# Patient Record
Sex: Female | Born: 1945 | Race: White | Hispanic: No | Marital: Married | Smoking: Never smoker
Health system: Southern US, Community
[De-identification: ages and names within clinical notes are randomized; demographics above are authoritative.]

## PROBLEM LIST (undated history)

## (undated) DIAGNOSIS — I10 Essential (primary) hypertension: Secondary | ICD-10-CM

## (undated) DIAGNOSIS — N189 Chronic kidney disease, unspecified: Secondary | ICD-10-CM

## (undated) DIAGNOSIS — M1990 Unspecified osteoarthritis, unspecified site: Secondary | ICD-10-CM

## (undated) DIAGNOSIS — D649 Anemia, unspecified: Secondary | ICD-10-CM

## (undated) DIAGNOSIS — K219 Gastro-esophageal reflux disease without esophagitis: Secondary | ICD-10-CM

## (undated) HISTORY — PX: CATARACT EXTRACTION: SUR2

## (undated) HISTORY — PX: EYE SURGERY: SHX253

## (undated) HISTORY — PX: ABDOMINAL HYSTERECTOMY: SHX81

---

## 2000-01-25 ENCOUNTER — Other Ambulatory Visit: Admission: RE | Admit: 2000-01-25 | Discharge: 2000-01-25 | Payer: Self-pay | Admitting: Gynecology

## 2002-03-05 ENCOUNTER — Encounter: Admission: RE | Admit: 2002-03-05 | Discharge: 2002-03-05 | Payer: Self-pay

## 2003-06-04 ENCOUNTER — Encounter: Admission: RE | Admit: 2003-06-04 | Discharge: 2003-06-04 | Payer: Self-pay

## 2003-07-15 ENCOUNTER — Other Ambulatory Visit: Admission: RE | Admit: 2003-07-15 | Discharge: 2003-07-15 | Payer: Self-pay | Admitting: Gynecology

## 2004-07-30 ENCOUNTER — Encounter: Admission: RE | Admit: 2004-07-30 | Discharge: 2004-07-30 | Payer: Self-pay | Admitting: Family Medicine

## 2004-08-13 ENCOUNTER — Encounter: Admission: RE | Admit: 2004-08-13 | Discharge: 2004-08-13 | Payer: Self-pay | Admitting: Family Medicine

## 2004-11-27 ENCOUNTER — Ambulatory Visit (HOSPITAL_COMMUNITY): Admission: RE | Admit: 2004-11-27 | Discharge: 2004-11-27 | Payer: Self-pay | Admitting: Gastroenterology

## 2005-09-27 ENCOUNTER — Encounter: Admission: RE | Admit: 2005-09-27 | Discharge: 2005-09-27 | Payer: Self-pay | Admitting: Family Medicine

## 2005-10-12 ENCOUNTER — Encounter: Admission: RE | Admit: 2005-10-12 | Discharge: 2005-10-12 | Payer: Self-pay | Admitting: Family Medicine

## 2006-10-13 ENCOUNTER — Encounter: Admission: RE | Admit: 2006-10-13 | Discharge: 2006-10-13 | Payer: Self-pay | Admitting: Family Medicine

## 2007-10-16 ENCOUNTER — Encounter: Admission: RE | Admit: 2007-10-16 | Discharge: 2007-10-16 | Payer: Self-pay | Admitting: Family Medicine

## 2008-10-22 ENCOUNTER — Encounter: Admission: RE | Admit: 2008-10-22 | Discharge: 2008-10-22 | Payer: Self-pay | Admitting: Family Medicine

## 2009-12-03 ENCOUNTER — Encounter: Admission: RE | Admit: 2009-12-03 | Discharge: 2009-12-03 | Payer: Self-pay | Admitting: Family Medicine

## 2010-11-21 ENCOUNTER — Other Ambulatory Visit: Payer: Self-pay | Admitting: Family Medicine

## 2010-11-21 DIAGNOSIS — Z1239 Encounter for other screening for malignant neoplasm of breast: Secondary | ICD-10-CM

## 2010-11-21 DIAGNOSIS — Z1231 Encounter for screening mammogram for malignant neoplasm of breast: Secondary | ICD-10-CM

## 2010-11-25 ENCOUNTER — Other Ambulatory Visit: Payer: Self-pay | Admitting: Family Medicine

## 2010-11-25 DIAGNOSIS — M8580 Other specified disorders of bone density and structure, unspecified site: Secondary | ICD-10-CM

## 2010-12-09 ENCOUNTER — Ambulatory Visit
Admission: RE | Admit: 2010-12-09 | Discharge: 2010-12-09 | Disposition: A | Discharge status: Home / Self Care | Payer: Medicare Other | Source: Ambulatory Visit | Attending: Family Medicine | Admitting: Family Medicine

## 2010-12-09 DIAGNOSIS — Z1231 Encounter for screening mammogram for malignant neoplasm of breast: Secondary | ICD-10-CM

## 2010-12-29 ENCOUNTER — Other Ambulatory Visit: Payer: Self-pay

## 2010-12-30 ENCOUNTER — Ambulatory Visit
Admission: RE | Admit: 2010-12-30 | Discharge: 2010-12-30 | Disposition: A | Discharge status: Home / Self Care | Payer: BLUE CROSS/BLUE SHIELD | Source: Ambulatory Visit | Attending: Family Medicine | Admitting: Family Medicine

## 2010-12-30 DIAGNOSIS — M8580 Other specified disorders of bone density and structure, unspecified site: Secondary | ICD-10-CM

## 2011-03-19 NOTE — Op Note (Signed)
NAMELUCAS, Rita            ACCOUNT NO.:  [id number]   MEDICAL RECORD NO.:  [id number]          PATIENT TYPE:  AMB   LOCATION:  ENDO                         FACILITY:  [facility]   PHYSICIAN:  James L. Rolla Flatten., M.D.DATE OF BIRTH:  [birth year]   DATE OF PROCEDURE:  11/27/2004  DATE OF DISCHARGE:                                 OPERATIVE REPORT   PROCEDURE:  Colonoscopy.   MEDICATIONS:  1.  Fentanyl 80 mcg IV.  2.  Versed 8 mg IV.   SCOPE:  Olympus pediatric adjustable colonoscope.   INDICATIONS:  The patient has had a strong family history of colon polyps in  her brother.   DESCRIPTION OF PROCEDURE:  The procedure had been explained to the patient  and consent obtained.  With the patient in the left lateral decubitus  position, the Olympus scope was inserted and advanced.  The prep was  excellent.  We were able to reach the cecum using abdominal pressure and  position changes.  The ileocecal valve and appendiceal orifice were seen.   The scope was withdrawn; the cecum, ascending colon, transverse colon,  descending and sigmoid colon were seen well.  Upon removal no polyps or  other lesions were seen.  The patient did have scattered diverticula.   The scope was withdrawn.  The patient tolerated the procedure well.   ASSESSMENT:  Strong family history of colon polyps, with negative  colonoscopy.  (V6 16.0)   PLAN:  Will recommend yearly Hemoccult.  Repeat colonoscopy in five years.      JLE/MEDQ  D:  11/27/2004  T:  11/27/2004  Job:  [id number]   cc:   Ruthy Dick, M.D.  [place], [place]

## 2011-11-05 ENCOUNTER — Other Ambulatory Visit: Payer: Self-pay | Admitting: Family Medicine

## 2011-11-05 DIAGNOSIS — Z1231 Encounter for screening mammogram for malignant neoplasm of breast: Secondary | ICD-10-CM

## 2011-12-08 DIAGNOSIS — IMO0002 Reserved for concepts with insufficient information to code with codable children: Secondary | ICD-10-CM | POA: Diagnosis not present

## 2011-12-08 DIAGNOSIS — Z79899 Other long term (current) drug therapy: Secondary | ICD-10-CM | POA: Diagnosis not present

## 2011-12-08 DIAGNOSIS — E7800 Pure hypercholesterolemia, unspecified: Secondary | ICD-10-CM | POA: Diagnosis not present

## 2011-12-08 DIAGNOSIS — Z0000 Encounter for general adult medical examination without abnormal findings: Secondary | ICD-10-CM | POA: Diagnosis not present

## 2011-12-08 DIAGNOSIS — I10 Essential (primary) hypertension: Secondary | ICD-10-CM | POA: Diagnosis not present

## 2011-12-13 ENCOUNTER — Ambulatory Visit
Admission: RE | Admit: 2011-12-13 | Discharge: 2011-12-13 | Disposition: A | Discharge status: Home / Self Care | Payer: Medicare Other | Source: Ambulatory Visit | Attending: Family Medicine | Admitting: Family Medicine

## 2011-12-13 DIAGNOSIS — Z1231 Encounter for screening mammogram for malignant neoplasm of breast: Secondary | ICD-10-CM | POA: Diagnosis not present

## 2012-06-07 DIAGNOSIS — IMO0002 Reserved for concepts with insufficient information to code with codable children: Secondary | ICD-10-CM | POA: Diagnosis not present

## 2012-06-07 DIAGNOSIS — E7800 Pure hypercholesterolemia, unspecified: Secondary | ICD-10-CM | POA: Diagnosis not present

## 2012-06-07 DIAGNOSIS — I10 Essential (primary) hypertension: Secondary | ICD-10-CM | POA: Diagnosis not present

## 2012-06-28 DIAGNOSIS — H25099 Other age-related incipient cataract, unspecified eye: Secondary | ICD-10-CM | POA: Diagnosis not present

## 2012-07-24 ENCOUNTER — Other Ambulatory Visit: Payer: Self-pay | Admitting: Family Medicine

## 2012-07-24 DIAGNOSIS — R1011 Right upper quadrant pain: Secondary | ICD-10-CM | POA: Diagnosis not present

## 2012-07-24 DIAGNOSIS — Z23 Encounter for immunization: Secondary | ICD-10-CM | POA: Diagnosis not present

## 2012-07-26 ENCOUNTER — Ambulatory Visit
Admission: RE | Admit: 2012-07-26 | Discharge: 2012-07-26 | Disposition: A | Discharge status: Home / Self Care | Payer: Medicare Other | Source: Ambulatory Visit | Attending: Family Medicine | Admitting: Family Medicine

## 2012-07-26 DIAGNOSIS — R1011 Right upper quadrant pain: Secondary | ICD-10-CM

## 2012-07-26 DIAGNOSIS — N289 Disorder of kidney and ureter, unspecified: Secondary | ICD-10-CM | POA: Diagnosis not present

## 2012-07-31 DIAGNOSIS — R1011 Right upper quadrant pain: Secondary | ICD-10-CM | POA: Diagnosis not present

## 2012-12-04 ENCOUNTER — Other Ambulatory Visit: Payer: Self-pay | Admitting: Family Medicine

## 2012-12-04 DIAGNOSIS — Z1231 Encounter for screening mammogram for malignant neoplasm of breast: Secondary | ICD-10-CM

## 2012-12-13 ENCOUNTER — Other Ambulatory Visit: Payer: Self-pay | Admitting: Family Medicine

## 2012-12-13 DIAGNOSIS — IMO0002 Reserved for concepts with insufficient information to code with codable children: Secondary | ICD-10-CM | POA: Diagnosis not present

## 2012-12-13 DIAGNOSIS — M8580 Other specified disorders of bone density and structure, unspecified site: Secondary | ICD-10-CM

## 2012-12-13 DIAGNOSIS — E7800 Pure hypercholesterolemia, unspecified: Secondary | ICD-10-CM | POA: Diagnosis not present

## 2012-12-13 DIAGNOSIS — I10 Essential (primary) hypertension: Secondary | ICD-10-CM | POA: Diagnosis not present

## 2012-12-13 DIAGNOSIS — Z0000 Encounter for general adult medical examination without abnormal findings: Secondary | ICD-10-CM | POA: Diagnosis not present

## 2013-01-04 ENCOUNTER — Ambulatory Visit
Admission: RE | Admit: 2013-01-04 | Discharge: 2013-01-04 | Disposition: A | Discharge status: Home / Self Care | Payer: Medicare Other | Source: Ambulatory Visit | Attending: Family Medicine | Admitting: Family Medicine

## 2013-01-04 DIAGNOSIS — Z1231 Encounter for screening mammogram for malignant neoplasm of breast: Secondary | ICD-10-CM | POA: Diagnosis not present

## 2013-01-04 DIAGNOSIS — M899 Disorder of bone, unspecified: Secondary | ICD-10-CM | POA: Diagnosis not present

## 2013-01-04 DIAGNOSIS — M949 Disorder of cartilage, unspecified: Secondary | ICD-10-CM | POA: Diagnosis not present

## 2013-01-04 DIAGNOSIS — M8580 Other specified disorders of bone density and structure, unspecified site: Secondary | ICD-10-CM

## 2013-01-11 ENCOUNTER — Other Ambulatory Visit: Payer: Self-pay | Admitting: Family Medicine

## 2013-01-11 DIAGNOSIS — R928 Other abnormal and inconclusive findings on diagnostic imaging of breast: Secondary | ICD-10-CM

## 2013-01-23 ENCOUNTER — Ambulatory Visit
Admission: RE | Admit: 2013-01-23 | Discharge: 2013-01-23 | Disposition: A | Discharge status: Home / Self Care | Payer: Medicare Other | Source: Ambulatory Visit | Attending: Family Medicine | Admitting: Family Medicine

## 2013-01-23 DIAGNOSIS — R928 Other abnormal and inconclusive findings on diagnostic imaging of breast: Secondary | ICD-10-CM

## 2013-06-13 DIAGNOSIS — I10 Essential (primary) hypertension: Secondary | ICD-10-CM | POA: Diagnosis not present

## 2013-06-13 DIAGNOSIS — E7800 Pure hypercholesterolemia, unspecified: Secondary | ICD-10-CM | POA: Diagnosis not present

## 2013-06-13 DIAGNOSIS — M899 Disorder of bone, unspecified: Secondary | ICD-10-CM | POA: Diagnosis not present

## 2013-07-16 DIAGNOSIS — R197 Diarrhea, unspecified: Secondary | ICD-10-CM | POA: Diagnosis not present

## 2013-07-17 DIAGNOSIS — R197 Diarrhea, unspecified: Secondary | ICD-10-CM | POA: Diagnosis not present

## 2013-08-09 DIAGNOSIS — IMO0002 Reserved for concepts with insufficient information to code with codable children: Secondary | ICD-10-CM | POA: Diagnosis not present

## 2013-08-23 DIAGNOSIS — Z23 Encounter for immunization: Secondary | ICD-10-CM | POA: Diagnosis not present

## 2013-09-04 DIAGNOSIS — H02839 Dermatochalasis of unspecified eye, unspecified eyelid: Secondary | ICD-10-CM | POA: Diagnosis not present

## 2013-09-04 DIAGNOSIS — H25049 Posterior subcapsular polar age-related cataract, unspecified eye: Secondary | ICD-10-CM | POA: Diagnosis not present

## 2013-09-04 DIAGNOSIS — H5210 Myopia, unspecified eye: Secondary | ICD-10-CM | POA: Diagnosis not present

## 2013-09-04 DIAGNOSIS — H25019 Cortical age-related cataract, unspecified eye: Secondary | ICD-10-CM | POA: Diagnosis not present

## 2013-09-04 DIAGNOSIS — H2510 Age-related nuclear cataract, unspecified eye: Secondary | ICD-10-CM | POA: Diagnosis not present

## 2013-09-04 DIAGNOSIS — H18419 Arcus senilis, unspecified eye: Secondary | ICD-10-CM | POA: Diagnosis not present

## 2013-11-05 DIAGNOSIS — H269 Unspecified cataract: Secondary | ICD-10-CM | POA: Diagnosis not present

## 2013-11-05 DIAGNOSIS — H2510 Age-related nuclear cataract, unspecified eye: Secondary | ICD-10-CM | POA: Diagnosis not present

## 2013-11-06 DIAGNOSIS — H2510 Age-related nuclear cataract, unspecified eye: Secondary | ICD-10-CM | POA: Diagnosis not present

## 2013-11-19 DIAGNOSIS — H269 Unspecified cataract: Secondary | ICD-10-CM | POA: Diagnosis not present

## 2013-11-19 DIAGNOSIS — H2510 Age-related nuclear cataract, unspecified eye: Secondary | ICD-10-CM | POA: Diagnosis not present

## 2013-12-17 ENCOUNTER — Other Ambulatory Visit: Payer: Self-pay

## 2013-12-17 DIAGNOSIS — I10 Essential (primary) hypertension: Secondary | ICD-10-CM | POA: Diagnosis not present

## 2013-12-17 DIAGNOSIS — Z1231 Encounter for screening mammogram for malignant neoplasm of breast: Secondary | ICD-10-CM

## 2013-12-17 DIAGNOSIS — E7800 Pure hypercholesterolemia, unspecified: Secondary | ICD-10-CM | POA: Diagnosis not present

## 2013-12-17 DIAGNOSIS — N183 Chronic kidney disease, stage 3 unspecified: Secondary | ICD-10-CM | POA: Diagnosis not present

## 2013-12-17 DIAGNOSIS — Z0000 Encounter for general adult medical examination without abnormal findings: Secondary | ICD-10-CM | POA: Diagnosis not present

## 2014-02-05 ENCOUNTER — Ambulatory Visit
Admission: RE | Admit: 2014-02-05 | Discharge: 2014-02-05 | Disposition: A | Discharge status: Home / Self Care | Payer: BC Managed Care – PPO | Source: Ambulatory Visit

## 2014-02-05 DIAGNOSIS — Z1231 Encounter for screening mammogram for malignant neoplasm of breast: Secondary | ICD-10-CM | POA: Diagnosis not present

## 2014-06-24 DIAGNOSIS — E7800 Pure hypercholesterolemia, unspecified: Secondary | ICD-10-CM | POA: Diagnosis not present

## 2014-06-24 DIAGNOSIS — I10 Essential (primary) hypertension: Secondary | ICD-10-CM | POA: Diagnosis not present

## 2014-06-24 DIAGNOSIS — N183 Chronic kidney disease, stage 3 unspecified: Secondary | ICD-10-CM | POA: Diagnosis not present

## 2014-08-16 DIAGNOSIS — Z23 Encounter for immunization: Secondary | ICD-10-CM | POA: Diagnosis not present

## 2014-12-13 DIAGNOSIS — Z1211 Encounter for screening for malignant neoplasm of colon: Secondary | ICD-10-CM | POA: Diagnosis not present

## 2014-12-13 DIAGNOSIS — Z8371 Family history of colonic polyps: Secondary | ICD-10-CM | POA: Diagnosis not present

## 2014-12-25 DIAGNOSIS — N183 Chronic kidney disease, stage 3 (moderate): Secondary | ICD-10-CM | POA: Diagnosis not present

## 2014-12-25 DIAGNOSIS — M8580 Other specified disorders of bone density and structure, unspecified site: Secondary | ICD-10-CM | POA: Diagnosis not present

## 2014-12-25 DIAGNOSIS — Z6823 Body mass index (BMI) 23.0-23.9, adult: Secondary | ICD-10-CM | POA: Diagnosis not present

## 2014-12-25 DIAGNOSIS — Z1389 Encounter for screening for other disorder: Secondary | ICD-10-CM | POA: Diagnosis not present

## 2014-12-25 DIAGNOSIS — Z0000 Encounter for general adult medical examination without abnormal findings: Secondary | ICD-10-CM | POA: Diagnosis not present

## 2014-12-25 DIAGNOSIS — E780 Pure hypercholesterolemia: Secondary | ICD-10-CM | POA: Diagnosis not present

## 2014-12-25 DIAGNOSIS — Z9181 History of falling: Secondary | ICD-10-CM | POA: Diagnosis not present

## 2014-12-25 DIAGNOSIS — I10 Essential (primary) hypertension: Secondary | ICD-10-CM | POA: Diagnosis not present

## 2014-12-27 ENCOUNTER — Other Ambulatory Visit: Payer: Self-pay | Admitting: Family Medicine

## 2014-12-27 DIAGNOSIS — Z1231 Encounter for screening mammogram for malignant neoplasm of breast: Secondary | ICD-10-CM

## 2014-12-27 DIAGNOSIS — R5381 Other malaise: Secondary | ICD-10-CM

## 2014-12-30 ENCOUNTER — Other Ambulatory Visit: Payer: Self-pay | Admitting: Family Medicine

## 2014-12-30 DIAGNOSIS — M8580 Other specified disorders of bone density and structure, unspecified site: Secondary | ICD-10-CM

## 2015-02-10 ENCOUNTER — Ambulatory Visit
Admission: RE | Admit: 2015-02-10 | Discharge: 2015-02-10 | Disposition: A | Discharge status: Home / Self Care | Payer: Medicare Other | Source: Ambulatory Visit | Attending: Family Medicine | Admitting: Family Medicine

## 2015-02-10 DIAGNOSIS — M8580 Other specified disorders of bone density and structure, unspecified site: Secondary | ICD-10-CM

## 2015-02-10 DIAGNOSIS — M85852 Other specified disorders of bone density and structure, left thigh: Secondary | ICD-10-CM | POA: Diagnosis not present

## 2015-02-10 DIAGNOSIS — Z1231 Encounter for screening mammogram for malignant neoplasm of breast: Secondary | ICD-10-CM | POA: Diagnosis not present

## 2015-02-10 DIAGNOSIS — M8588 Other specified disorders of bone density and structure, other site: Secondary | ICD-10-CM | POA: Diagnosis not present

## 2015-04-03 DIAGNOSIS — M19071 Primary osteoarthritis, right ankle and foot: Secondary | ICD-10-CM | POA: Diagnosis not present

## 2015-04-03 DIAGNOSIS — M19072 Primary osteoarthritis, left ankle and foot: Secondary | ICD-10-CM | POA: Diagnosis not present

## 2015-04-03 DIAGNOSIS — Q828 Other specified congenital malformations of skin: Secondary | ICD-10-CM | POA: Diagnosis not present

## 2015-04-03 DIAGNOSIS — M2012 Hallux valgus (acquired), left foot: Secondary | ICD-10-CM | POA: Diagnosis not present

## 2015-05-16 DIAGNOSIS — M2022 Hallux rigidus, left foot: Secondary | ICD-10-CM | POA: Diagnosis not present

## 2015-05-16 DIAGNOSIS — M2012 Hallux valgus (acquired), left foot: Secondary | ICD-10-CM | POA: Diagnosis not present

## 2015-06-30 DIAGNOSIS — E780 Pure hypercholesterolemia: Secondary | ICD-10-CM | POA: Diagnosis not present

## 2015-06-30 DIAGNOSIS — I10 Essential (primary) hypertension: Secondary | ICD-10-CM | POA: Diagnosis not present

## 2015-06-30 DIAGNOSIS — Z1389 Encounter for screening for other disorder: Secondary | ICD-10-CM | POA: Diagnosis not present

## 2015-06-30 DIAGNOSIS — Z23 Encounter for immunization: Secondary | ICD-10-CM | POA: Diagnosis not present

## 2015-06-30 DIAGNOSIS — Z9181 History of falling: Secondary | ICD-10-CM | POA: Diagnosis not present

## 2015-06-30 DIAGNOSIS — N3281 Overactive bladder: Secondary | ICD-10-CM | POA: Diagnosis not present

## 2015-06-30 DIAGNOSIS — N183 Chronic kidney disease, stage 3 (moderate): Secondary | ICD-10-CM | POA: Diagnosis not present

## 2015-06-30 DIAGNOSIS — B351 Tinea unguium: Secondary | ICD-10-CM | POA: Diagnosis not present

## 2015-08-13 DIAGNOSIS — R197 Diarrhea, unspecified: Secondary | ICD-10-CM | POA: Diagnosis not present

## 2015-08-13 DIAGNOSIS — Z6824 Body mass index (BMI) 24.0-24.9, adult: Secondary | ICD-10-CM | POA: Diagnosis not present

## 2015-08-13 DIAGNOSIS — M25551 Pain in right hip: Secondary | ICD-10-CM | POA: Diagnosis not present

## 2015-08-25 DIAGNOSIS — R197 Diarrhea, unspecified: Secondary | ICD-10-CM | POA: Diagnosis not present

## 2015-08-29 DIAGNOSIS — Z23 Encounter for immunization: Secondary | ICD-10-CM | POA: Diagnosis not present

## 2015-09-03 DIAGNOSIS — A09 Infectious gastroenteritis and colitis, unspecified: Secondary | ICD-10-CM | POA: Diagnosis not present

## 2015-09-10 DIAGNOSIS — N390 Urinary tract infection, site not specified: Secondary | ICD-10-CM | POA: Diagnosis not present

## 2015-09-10 DIAGNOSIS — R319 Hematuria, unspecified: Secondary | ICD-10-CM | POA: Diagnosis not present

## 2015-09-10 DIAGNOSIS — Z6823 Body mass index (BMI) 23.0-23.9, adult: Secondary | ICD-10-CM | POA: Diagnosis not present

## 2015-09-16 DIAGNOSIS — A09 Infectious gastroenteritis and colitis, unspecified: Secondary | ICD-10-CM | POA: Diagnosis not present

## 2015-09-22 DIAGNOSIS — R197 Diarrhea, unspecified: Secondary | ICD-10-CM | POA: Diagnosis not present

## 2015-11-04 DIAGNOSIS — A09 Infectious gastroenteritis and colitis, unspecified: Secondary | ICD-10-CM | POA: Diagnosis not present

## 2015-12-26 DIAGNOSIS — N3946 Mixed incontinence: Secondary | ICD-10-CM | POA: Diagnosis not present

## 2015-12-26 DIAGNOSIS — Z0000 Encounter for general adult medical examination without abnormal findings: Secondary | ICD-10-CM | POA: Diagnosis not present

## 2015-12-26 DIAGNOSIS — R351 Nocturia: Secondary | ICD-10-CM | POA: Diagnosis not present

## 2015-12-26 DIAGNOSIS — R350 Frequency of micturition: Secondary | ICD-10-CM | POA: Diagnosis not present

## 2015-12-31 DIAGNOSIS — Z0000 Encounter for general adult medical examination without abnormal findings: Secondary | ICD-10-CM | POA: Diagnosis not present

## 2015-12-31 DIAGNOSIS — I10 Essential (primary) hypertension: Secondary | ICD-10-CM | POA: Diagnosis not present

## 2015-12-31 DIAGNOSIS — E7800 Pure hypercholesterolemia, unspecified: Secondary | ICD-10-CM | POA: Diagnosis not present

## 2015-12-31 DIAGNOSIS — Z6823 Body mass index (BMI) 23.0-23.9, adult: Secondary | ICD-10-CM | POA: Diagnosis not present

## 2015-12-31 DIAGNOSIS — Z1159 Encounter for screening for other viral diseases: Secondary | ICD-10-CM | POA: Diagnosis not present

## 2015-12-31 DIAGNOSIS — N183 Chronic kidney disease, stage 3 (moderate): Secondary | ICD-10-CM | POA: Diagnosis not present

## 2016-01-13 ENCOUNTER — Other Ambulatory Visit: Payer: Self-pay

## 2016-01-13 DIAGNOSIS — Z1231 Encounter for screening mammogram for malignant neoplasm of breast: Secondary | ICD-10-CM

## 2016-01-16 DIAGNOSIS — Z0000 Encounter for general adult medical examination without abnormal findings: Secondary | ICD-10-CM | POA: Diagnosis not present

## 2016-01-16 DIAGNOSIS — N3946 Mixed incontinence: Secondary | ICD-10-CM | POA: Diagnosis not present

## 2016-01-16 DIAGNOSIS — R351 Nocturia: Secondary | ICD-10-CM | POA: Diagnosis not present

## 2016-01-16 DIAGNOSIS — R350 Frequency of micturition: Secondary | ICD-10-CM | POA: Diagnosis not present

## 2016-01-21 DIAGNOSIS — N8110 Cystocele, unspecified: Secondary | ICD-10-CM | POA: Diagnosis not present

## 2016-01-21 DIAGNOSIS — N3946 Mixed incontinence: Secondary | ICD-10-CM | POA: Diagnosis not present

## 2016-02-09 DIAGNOSIS — M19071 Primary osteoarthritis, right ankle and foot: Secondary | ICD-10-CM | POA: Diagnosis not present

## 2016-02-17 DIAGNOSIS — H11159 Pinguecula, unspecified eye: Secondary | ICD-10-CM | POA: Diagnosis not present

## 2016-02-17 DIAGNOSIS — H524 Presbyopia: Secondary | ICD-10-CM | POA: Diagnosis not present

## 2016-02-17 DIAGNOSIS — H52221 Regular astigmatism, right eye: Secondary | ICD-10-CM | POA: Diagnosis not present

## 2016-02-17 DIAGNOSIS — Z961 Presence of intraocular lens: Secondary | ICD-10-CM | POA: Diagnosis not present

## 2016-03-02 ENCOUNTER — Ambulatory Visit
Admission: RE | Admit: 2016-03-02 | Discharge: 2016-03-02 | Disposition: A | Discharge status: Home / Self Care | Payer: Medicare Other | Source: Ambulatory Visit

## 2016-03-02 DIAGNOSIS — Z1231 Encounter for screening mammogram for malignant neoplasm of breast: Secondary | ICD-10-CM | POA: Diagnosis not present

## 2016-03-05 DIAGNOSIS — M2012 Hallux valgus (acquired), left foot: Secondary | ICD-10-CM | POA: Diagnosis not present

## 2016-03-05 DIAGNOSIS — M2022 Hallux rigidus, left foot: Secondary | ICD-10-CM | POA: Diagnosis not present

## 2016-04-13 DIAGNOSIS — M21612 Bunion of left foot: Secondary | ICD-10-CM | POA: Diagnosis not present

## 2016-04-13 DIAGNOSIS — M2021 Hallux rigidus, right foot: Secondary | ICD-10-CM | POA: Diagnosis not present

## 2016-04-13 DIAGNOSIS — M2022 Hallux rigidus, left foot: Secondary | ICD-10-CM | POA: Diagnosis not present

## 2016-04-13 DIAGNOSIS — G8918 Other acute postprocedural pain: Secondary | ICD-10-CM | POA: Diagnosis not present

## 2016-04-13 DIAGNOSIS — M21611 Bunion of right foot: Secondary | ICD-10-CM | POA: Diagnosis not present

## 2016-04-16 DIAGNOSIS — Z4789 Encounter for other orthopedic aftercare: Secondary | ICD-10-CM | POA: Diagnosis not present

## 2016-04-28 DIAGNOSIS — M2012 Hallux valgus (acquired), left foot: Secondary | ICD-10-CM | POA: Diagnosis not present

## 2016-04-28 DIAGNOSIS — M2022 Hallux rigidus, left foot: Secondary | ICD-10-CM | POA: Diagnosis not present

## 2016-04-28 DIAGNOSIS — Z4789 Encounter for other orthopedic aftercare: Secondary | ICD-10-CM | POA: Diagnosis not present

## 2016-05-31 DIAGNOSIS — Z4789 Encounter for other orthopedic aftercare: Secondary | ICD-10-CM | POA: Diagnosis not present

## 2016-06-28 DIAGNOSIS — Z4789 Encounter for other orthopedic aftercare: Secondary | ICD-10-CM | POA: Diagnosis not present

## 2016-07-07 DIAGNOSIS — I10 Essential (primary) hypertension: Secondary | ICD-10-CM | POA: Diagnosis not present

## 2016-07-07 DIAGNOSIS — N3281 Overactive bladder: Secondary | ICD-10-CM | POA: Diagnosis not present

## 2016-07-07 DIAGNOSIS — N183 Chronic kidney disease, stage 3 (moderate): Secondary | ICD-10-CM | POA: Diagnosis not present

## 2016-07-07 DIAGNOSIS — Z6824 Body mass index (BMI) 24.0-24.9, adult: Secondary | ICD-10-CM | POA: Diagnosis not present

## 2016-07-07 DIAGNOSIS — E7800 Pure hypercholesterolemia, unspecified: Secondary | ICD-10-CM | POA: Diagnosis not present

## 2016-07-07 DIAGNOSIS — Z9181 History of falling: Secondary | ICD-10-CM | POA: Diagnosis not present

## 2016-07-07 DIAGNOSIS — Z1389 Encounter for screening for other disorder: Secondary | ICD-10-CM | POA: Diagnosis not present

## 2016-08-12 DIAGNOSIS — Z23 Encounter for immunization: Secondary | ICD-10-CM | POA: Diagnosis not present

## 2016-09-07 DIAGNOSIS — H26491 Other secondary cataract, right eye: Secondary | ICD-10-CM | POA: Diagnosis not present

## 2016-09-28 DIAGNOSIS — Z961 Presence of intraocular lens: Secondary | ICD-10-CM | POA: Diagnosis not present

## 2016-09-28 DIAGNOSIS — H25041 Posterior subcapsular polar age-related cataract, right eye: Secondary | ICD-10-CM | POA: Diagnosis not present

## 2016-09-28 DIAGNOSIS — H18413 Arcus senilis, bilateral: Secondary | ICD-10-CM | POA: Diagnosis not present

## 2016-09-28 DIAGNOSIS — H26491 Other secondary cataract, right eye: Secondary | ICD-10-CM | POA: Diagnosis not present

## 2016-09-28 DIAGNOSIS — H25011 Cortical age-related cataract, right eye: Secondary | ICD-10-CM | POA: Diagnosis not present

## 2016-09-28 DIAGNOSIS — H2511 Age-related nuclear cataract, right eye: Secondary | ICD-10-CM | POA: Diagnosis not present

## 2016-09-28 DIAGNOSIS — H43811 Vitreous degeneration, right eye: Secondary | ICD-10-CM | POA: Diagnosis not present

## 2017-01-31 ENCOUNTER — Other Ambulatory Visit: Payer: Self-pay | Admitting: Nurse Practitioner

## 2017-01-31 DIAGNOSIS — Z1231 Encounter for screening mammogram for malignant neoplasm of breast: Secondary | ICD-10-CM

## 2017-01-31 DIAGNOSIS — N183 Chronic kidney disease, stage 3 (moderate): Secondary | ICD-10-CM | POA: Diagnosis not present

## 2017-01-31 DIAGNOSIS — I10 Essential (primary) hypertension: Secondary | ICD-10-CM | POA: Diagnosis not present

## 2017-01-31 DIAGNOSIS — Z780 Asymptomatic menopausal state: Secondary | ICD-10-CM | POA: Diagnosis not present

## 2017-01-31 DIAGNOSIS — R5381 Other malaise: Secondary | ICD-10-CM

## 2017-01-31 DIAGNOSIS — E7800 Pure hypercholesterolemia, unspecified: Secondary | ICD-10-CM | POA: Diagnosis not present

## 2017-01-31 DIAGNOSIS — Z0000 Encounter for general adult medical examination without abnormal findings: Secondary | ICD-10-CM | POA: Diagnosis not present

## 2017-01-31 DIAGNOSIS — Z6824 Body mass index (BMI) 24.0-24.9, adult: Secondary | ICD-10-CM | POA: Diagnosis not present

## 2017-02-02 ENCOUNTER — Other Ambulatory Visit: Payer: Self-pay | Admitting: Nurse Practitioner

## 2017-02-02 DIAGNOSIS — M8580 Other specified disorders of bone density and structure, unspecified site: Secondary | ICD-10-CM

## 2017-02-17 DIAGNOSIS — Z961 Presence of intraocular lens: Secondary | ICD-10-CM | POA: Diagnosis not present

## 2017-02-17 DIAGNOSIS — H11159 Pinguecula, unspecified eye: Secondary | ICD-10-CM | POA: Diagnosis not present

## 2017-03-03 ENCOUNTER — Ambulatory Visit
Admission: RE | Admit: 2017-03-03 | Discharge: 2017-03-03 | Disposition: A | Discharge status: Home / Self Care | Payer: Medicare Other | Source: Ambulatory Visit | Attending: Nurse Practitioner | Admitting: Nurse Practitioner

## 2017-03-03 DIAGNOSIS — Z1231 Encounter for screening mammogram for malignant neoplasm of breast: Secondary | ICD-10-CM | POA: Diagnosis not present

## 2017-03-03 DIAGNOSIS — M8589 Other specified disorders of bone density and structure, multiple sites: Secondary | ICD-10-CM | POA: Diagnosis not present

## 2017-03-03 DIAGNOSIS — Z780 Asymptomatic menopausal state: Secondary | ICD-10-CM | POA: Diagnosis not present

## 2017-03-03 DIAGNOSIS — M8580 Other specified disorders of bone density and structure, unspecified site: Secondary | ICD-10-CM

## 2017-03-18 DIAGNOSIS — H1131 Conjunctival hemorrhage, right eye: Secondary | ICD-10-CM | POA: Diagnosis not present

## 2017-05-26 DIAGNOSIS — M19071 Primary osteoarthritis, right ankle and foot: Secondary | ICD-10-CM | POA: Diagnosis not present

## 2017-07-07 DIAGNOSIS — R319 Hematuria, unspecified: Secondary | ICD-10-CM | POA: Diagnosis not present

## 2017-07-07 DIAGNOSIS — N390 Urinary tract infection, site not specified: Secondary | ICD-10-CM | POA: Diagnosis not present

## 2017-08-08 DIAGNOSIS — I10 Essential (primary) hypertension: Secondary | ICD-10-CM | POA: Diagnosis not present

## 2017-08-08 DIAGNOSIS — N3281 Overactive bladder: Secondary | ICD-10-CM | POA: Diagnosis not present

## 2017-08-08 DIAGNOSIS — N183 Chronic kidney disease, stage 3 (moderate): Secondary | ICD-10-CM | POA: Diagnosis not present

## 2017-08-08 DIAGNOSIS — Z6824 Body mass index (BMI) 24.0-24.9, adult: Secondary | ICD-10-CM | POA: Diagnosis not present

## 2017-08-08 DIAGNOSIS — Z23 Encounter for immunization: Secondary | ICD-10-CM | POA: Diagnosis not present

## 2017-08-08 DIAGNOSIS — E7800 Pure hypercholesterolemia, unspecified: Secondary | ICD-10-CM | POA: Diagnosis not present

## 2017-09-05 ENCOUNTER — Encounter: Payer: Self-pay | Admitting: Podiatry

## 2017-09-05 ENCOUNTER — Ambulatory Visit: Payer: Medicare Other

## 2017-09-05 ENCOUNTER — Ambulatory Visit (INDEPENDENT_AMBULATORY_CARE_PROVIDER_SITE_OTHER): Payer: Medicare Other | Admitting: Podiatry

## 2017-09-05 DIAGNOSIS — M79674 Pain in right toe(s): Secondary | ICD-10-CM

## 2017-09-05 DIAGNOSIS — M2041 Other hammer toe(s) (acquired), right foot: Secondary | ICD-10-CM | POA: Diagnosis not present

## 2017-09-05 DIAGNOSIS — L600 Ingrowing nail: Secondary | ICD-10-CM

## 2017-09-05 NOTE — Patient Instructions (Signed)

## 2017-09-05 NOTE — Progress Notes (Signed)
   Subjective:    Patient ID: Rita Whitaker, female    DOB: July 07, 1946, 71 y.o.   MRN: [id number]  HPI    Review of Systems  All other systems reviewed and are negative.      Objective:   Physical Exam        Assessment & Plan:

## 2017-09-06 NOTE — Progress Notes (Signed)
Subjective:    Patient ID: Rita Whitaker, female   DOB: 71 y.o.   MRN: [id number]   HPI patient presents stating she has a painful ingrown toenail on her right third toe and it's gradually making it hard for him harder for her to wear shoe gear comfortably. States she's tried to soak it and trim it without relief and patient does not smoke and likes to be active but is having trouble wearing shoes    Review of Systems  All other systems reviewed and are negative.       Objective:  Physical Exam  Constitutional: She appears well-developed and well-nourished.  Cardiovascular: Intact distal pulses.  Pulmonary/Chest: Effort normal.  Musculoskeletal: Normal range of motion.  Neurological: She is alert.  Skin: Skin is warm.  Nursing note and vitals reviewed.  neurovascular status intact muscle strength was adequate range of motion within normal limits with patient noted to have incurvated third nail right medial border that's painful when pressed and making shoe gear difficult. There is distal redness but no active drainage noted     Assessment:  Ingrown toenail deformity third right with mild digital deformity as it presses against the second toe       Plan:  H&P condition reviewed with patient. I've recommended correction of the ingrown I explained procedure and risk and allow her to sign consent form. Today I infiltrated the right third digit 60 mg like Marcaine mixture remove the border exposed matrix and applied phenol 3 applications 30 seconds followed by alcohol lavage and sterile dressing. Gave instructions on soaks and reappoint

## 2017-09-14 ENCOUNTER — Ambulatory Visit (INDEPENDENT_AMBULATORY_CARE_PROVIDER_SITE_OTHER): Payer: Medicare Other | Admitting: Podiatry

## 2017-09-14 ENCOUNTER — Encounter: Payer: Self-pay | Admitting: Podiatry

## 2017-09-14 DIAGNOSIS — L03031 Cellulitis of right toe: Secondary | ICD-10-CM

## 2017-09-15 NOTE — Progress Notes (Signed)
Subjective:    Patient ID: Rita Whitaker, female   DOB: 71 y.o.   MRN: [id number]   HPI patient states the third digit has become painful with multiple blisters on the toe with no history of trauma    ROS      Objective:  Physical Exam neurovascular status intact with healing surgical site right third digit from previous ingrown toenail with small localized blisters and no proximal edema erythema or drainage noted     Assessment:   Appears to be local reactive process with no indications of proximal infection      Plan:     Localized paronychia which I went ahead today cleaned up and expressed the small blister with clear fluid and advised on soaks to be switched to liquid Dial patient to be seen back again if it should turn red any swelling should occur or throbbing. I instructed patient on what to watch for and she'll be seen back as needed

## 2018-01-13 ENCOUNTER — Other Ambulatory Visit: Payer: Self-pay | Admitting: Nurse Practitioner

## 2018-01-13 DIAGNOSIS — Z139 Encounter for screening, unspecified: Secondary | ICD-10-CM

## 2018-02-06 DIAGNOSIS — Z6823 Body mass index (BMI) 23.0-23.9, adult: Secondary | ICD-10-CM | POA: Diagnosis not present

## 2018-02-06 DIAGNOSIS — Z1231 Encounter for screening mammogram for malignant neoplasm of breast: Secondary | ICD-10-CM | POA: Diagnosis not present

## 2018-02-06 DIAGNOSIS — Z136 Encounter for screening for cardiovascular disorders: Secondary | ICD-10-CM | POA: Diagnosis not present

## 2018-02-06 DIAGNOSIS — Z0000 Encounter for general adult medical examination without abnormal findings: Secondary | ICD-10-CM | POA: Diagnosis not present

## 2018-02-06 DIAGNOSIS — E785 Hyperlipidemia, unspecified: Secondary | ICD-10-CM | POA: Diagnosis not present

## 2018-02-06 DIAGNOSIS — I10 Essential (primary) hypertension: Secondary | ICD-10-CM | POA: Diagnosis not present

## 2018-02-06 DIAGNOSIS — E7800 Pure hypercholesterolemia, unspecified: Secondary | ICD-10-CM | POA: Diagnosis not present

## 2018-02-06 DIAGNOSIS — N183 Chronic kidney disease, stage 3 (moderate): Secondary | ICD-10-CM | POA: Diagnosis not present

## 2018-02-06 DIAGNOSIS — N3281 Overactive bladder: Secondary | ICD-10-CM | POA: Diagnosis not present

## 2018-03-10 ENCOUNTER — Ambulatory Visit
Admission: RE | Admit: 2018-03-10 | Discharge: 2018-03-10 | Disposition: A | Discharge status: Home / Self Care | Payer: Medicare Other | Source: Ambulatory Visit | Attending: Nurse Practitioner | Admitting: Nurse Practitioner

## 2018-03-10 DIAGNOSIS — Z139 Encounter for screening, unspecified: Secondary | ICD-10-CM

## 2018-03-10 DIAGNOSIS — Z1231 Encounter for screening mammogram for malignant neoplasm of breast: Secondary | ICD-10-CM | POA: Diagnosis not present

## 2018-04-04 DIAGNOSIS — H26492 Other secondary cataract, left eye: Secondary | ICD-10-CM | POA: Diagnosis not present

## 2018-04-04 DIAGNOSIS — Z961 Presence of intraocular lens: Secondary | ICD-10-CM | POA: Diagnosis not present

## 2018-04-04 DIAGNOSIS — I10 Essential (primary) hypertension: Secondary | ICD-10-CM | POA: Diagnosis not present

## 2018-04-04 DIAGNOSIS — H52223 Regular astigmatism, bilateral: Secondary | ICD-10-CM | POA: Diagnosis not present

## 2018-04-04 DIAGNOSIS — H5203 Hypermetropia, bilateral: Secondary | ICD-10-CM | POA: Diagnosis not present

## 2018-04-04 DIAGNOSIS — H524 Presbyopia: Secondary | ICD-10-CM | POA: Diagnosis not present

## 2018-04-04 DIAGNOSIS — H11159 Pinguecula, unspecified eye: Secondary | ICD-10-CM | POA: Diagnosis not present

## 2018-05-08 DIAGNOSIS — Z6823 Body mass index (BMI) 23.0-23.9, adult: Secondary | ICD-10-CM | POA: Diagnosis not present

## 2018-05-08 DIAGNOSIS — N390 Urinary tract infection, site not specified: Secondary | ICD-10-CM | POA: Diagnosis not present

## 2018-05-26 ENCOUNTER — Ambulatory Visit (INDEPENDENT_AMBULATORY_CARE_PROVIDER_SITE_OTHER): Payer: Medicare Other

## 2018-05-26 ENCOUNTER — Ambulatory Visit (INDEPENDENT_AMBULATORY_CARE_PROVIDER_SITE_OTHER): Payer: Medicare Other | Admitting: Podiatry

## 2018-05-26 ENCOUNTER — Encounter: Payer: Self-pay | Admitting: Podiatry

## 2018-05-26 ENCOUNTER — Other Ambulatory Visit: Payer: Self-pay | Admitting: Podiatry

## 2018-05-26 DIAGNOSIS — M79675 Pain in left toe(s): Secondary | ICD-10-CM | POA: Diagnosis not present

## 2018-05-26 DIAGNOSIS — M2042 Other hammer toe(s) (acquired), left foot: Secondary | ICD-10-CM

## 2018-05-26 DIAGNOSIS — B070 Plantar wart: Secondary | ICD-10-CM | POA: Diagnosis not present

## 2018-05-27 NOTE — Progress Notes (Signed)
Subjective:   Patient ID: Rita Whitaker, female   DOB: 72 y.o.   MRN: [id number]   HPI Patient has developed a painful corn on the bottom of the left second toe is painful with palpation also has a lesion on the left first metatarsal.  States that the one on the second toe has come up fairly recently just in the last few weeks and is bothersome and she does not remember trauma.  Patient does not smoke and would like to be active   Review of Systems  All other systems reviewed and are negative.       Objective:  Physical Exam  Constitutional: She appears well-developed and well-nourished.  Cardiovascular: Intact distal pulses.  Pulmonary/Chest: Effort normal.  Musculoskeletal: Normal range of motion.  Neurological: She is alert.  Skin: Skin is warm.  Nursing note and vitals reviewed.   Neurovascular status found to be intact muscle strength is adequate range of motion within normal limits with patient found to have lesion plantar aspect left second toe which measures approximately 5 x 5 mm and is painful to lateral pressure.  Upon debridement pinpoint bleeding is noted and also there is a callus on the bottom of the left first metatarsal     Assessment:  Probability for verruca plantaris plantar aspect left     Plan:  H&P condition reviewed and sterile debridement of lesion accomplished and then salicylic acid applied to lesion with sterile dressing.  Gave instructions on soaks and will reappoint if the lesion maintains or gets larger or becomes painful

## 2018-08-10 DIAGNOSIS — Z1331 Encounter for screening for depression: Secondary | ICD-10-CM | POA: Diagnosis not present

## 2018-08-10 DIAGNOSIS — Z23 Encounter for immunization: Secondary | ICD-10-CM | POA: Diagnosis not present

## 2018-08-10 DIAGNOSIS — N3281 Overactive bladder: Secondary | ICD-10-CM | POA: Diagnosis not present

## 2018-08-10 DIAGNOSIS — E7800 Pure hypercholesterolemia, unspecified: Secondary | ICD-10-CM | POA: Diagnosis not present

## 2018-08-10 DIAGNOSIS — I10 Essential (primary) hypertension: Secondary | ICD-10-CM | POA: Diagnosis not present

## 2018-08-10 DIAGNOSIS — N183 Chronic kidney disease, stage 3 (moderate): Secondary | ICD-10-CM | POA: Diagnosis not present

## 2018-08-25 DIAGNOSIS — N3946 Mixed incontinence: Secondary | ICD-10-CM | POA: Diagnosis not present

## 2018-08-25 DIAGNOSIS — N8110 Cystocele, unspecified: Secondary | ICD-10-CM | POA: Diagnosis not present

## 2018-09-18 DIAGNOSIS — N390 Urinary tract infection, site not specified: Secondary | ICD-10-CM | POA: Diagnosis not present

## 2018-09-18 DIAGNOSIS — Z6824 Body mass index (BMI) 24.0-24.9, adult: Secondary | ICD-10-CM | POA: Diagnosis not present

## 2018-10-13 DIAGNOSIS — N3941 Urge incontinence: Secondary | ICD-10-CM | POA: Diagnosis not present

## 2018-10-13 DIAGNOSIS — N8110 Cystocele, unspecified: Secondary | ICD-10-CM | POA: Diagnosis not present

## 2018-11-30 DIAGNOSIS — H52223 Regular astigmatism, bilateral: Secondary | ICD-10-CM | POA: Diagnosis not present

## 2018-11-30 DIAGNOSIS — Z961 Presence of intraocular lens: Secondary | ICD-10-CM | POA: Diagnosis not present

## 2018-11-30 DIAGNOSIS — H524 Presbyopia: Secondary | ICD-10-CM | POA: Diagnosis not present

## 2018-11-30 DIAGNOSIS — H26492 Other secondary cataract, left eye: Secondary | ICD-10-CM | POA: Diagnosis not present

## 2018-11-30 DIAGNOSIS — H5203 Hypermetropia, bilateral: Secondary | ICD-10-CM | POA: Diagnosis not present

## 2018-12-08 DIAGNOSIS — H5989 Other postprocedural complications and disorders of eye and adnexa, not elsewhere classified: Secondary | ICD-10-CM | POA: Diagnosis not present

## 2018-12-08 DIAGNOSIS — H26492 Other secondary cataract, left eye: Secondary | ICD-10-CM | POA: Diagnosis not present

## 2019-02-15 DIAGNOSIS — N183 Chronic kidney disease, stage 3 (moderate): Secondary | ICD-10-CM | POA: Diagnosis not present

## 2019-02-15 DIAGNOSIS — E7800 Pure hypercholesterolemia, unspecified: Secondary | ICD-10-CM | POA: Diagnosis not present

## 2019-02-15 DIAGNOSIS — N3281 Overactive bladder: Secondary | ICD-10-CM | POA: Diagnosis not present

## 2019-02-15 DIAGNOSIS — I10 Essential (primary) hypertension: Secondary | ICD-10-CM | POA: Diagnosis not present

## 2019-03-08 DIAGNOSIS — N390 Urinary tract infection, site not specified: Secondary | ICD-10-CM | POA: Diagnosis not present

## 2019-03-16 DIAGNOSIS — Z6824 Body mass index (BMI) 24.0-24.9, adult: Secondary | ICD-10-CM | POA: Diagnosis not present

## 2019-03-16 DIAGNOSIS — L270 Generalized skin eruption due to drugs and medicaments taken internally: Secondary | ICD-10-CM | POA: Diagnosis not present

## 2019-04-20 DIAGNOSIS — R350 Frequency of micturition: Secondary | ICD-10-CM | POA: Diagnosis not present

## 2019-04-20 DIAGNOSIS — N8110 Cystocele, unspecified: Secondary | ICD-10-CM | POA: Diagnosis not present

## 2019-06-07 DIAGNOSIS — L821 Other seborrheic keratosis: Secondary | ICD-10-CM | POA: Diagnosis not present

## 2019-06-07 DIAGNOSIS — D225 Melanocytic nevi of trunk: Secondary | ICD-10-CM | POA: Diagnosis not present

## 2019-06-11 DIAGNOSIS — I10 Essential (primary) hypertension: Secondary | ICD-10-CM | POA: Diagnosis not present

## 2019-06-11 DIAGNOSIS — Z961 Presence of intraocular lens: Secondary | ICD-10-CM | POA: Diagnosis not present

## 2019-06-11 DIAGNOSIS — H524 Presbyopia: Secondary | ICD-10-CM | POA: Diagnosis not present

## 2019-06-11 DIAGNOSIS — H5203 Hypermetropia, bilateral: Secondary | ICD-10-CM | POA: Diagnosis not present

## 2019-06-11 DIAGNOSIS — H52221 Regular astigmatism, right eye: Secondary | ICD-10-CM | POA: Diagnosis not present

## 2019-06-11 DIAGNOSIS — H11159 Pinguecula, unspecified eye: Secondary | ICD-10-CM | POA: Diagnosis not present

## 2019-06-15 DIAGNOSIS — N8110 Cystocele, unspecified: Secondary | ICD-10-CM | POA: Diagnosis not present

## 2019-06-15 DIAGNOSIS — N3941 Urge incontinence: Secondary | ICD-10-CM | POA: Diagnosis not present

## 2019-08-01 DIAGNOSIS — Z6824 Body mass index (BMI) 24.0-24.9, adult: Secondary | ICD-10-CM | POA: Diagnosis not present

## 2019-08-01 DIAGNOSIS — S0101XA Laceration without foreign body of scalp, initial encounter: Secondary | ICD-10-CM | POA: Diagnosis not present

## 2019-08-01 DIAGNOSIS — S0990XA Unspecified injury of head, initial encounter: Secondary | ICD-10-CM | POA: Diagnosis not present

## 2019-08-08 DIAGNOSIS — Z23 Encounter for immunization: Secondary | ICD-10-CM | POA: Diagnosis not present

## 2019-08-08 DIAGNOSIS — S0990XA Unspecified injury of head, initial encounter: Secondary | ICD-10-CM | POA: Diagnosis not present

## 2019-08-08 DIAGNOSIS — Z9181 History of falling: Secondary | ICD-10-CM | POA: Diagnosis not present

## 2019-08-08 DIAGNOSIS — S0101XA Laceration without foreign body of scalp, initial encounter: Secondary | ICD-10-CM | POA: Diagnosis not present

## 2019-08-14 DIAGNOSIS — E7800 Pure hypercholesterolemia, unspecified: Secondary | ICD-10-CM | POA: Diagnosis not present

## 2019-08-14 DIAGNOSIS — N183 Chronic kidney disease, stage 3 unspecified: Secondary | ICD-10-CM | POA: Diagnosis not present

## 2019-08-16 DIAGNOSIS — E7800 Pure hypercholesterolemia, unspecified: Secondary | ICD-10-CM | POA: Diagnosis not present

## 2019-08-16 DIAGNOSIS — N183 Chronic kidney disease, stage 3 unspecified: Secondary | ICD-10-CM | POA: Diagnosis not present

## 2019-08-16 DIAGNOSIS — Z1331 Encounter for screening for depression: Secondary | ICD-10-CM | POA: Diagnosis not present

## 2019-08-16 DIAGNOSIS — N3281 Overactive bladder: Secondary | ICD-10-CM | POA: Diagnosis not present

## 2019-08-16 DIAGNOSIS — I10 Essential (primary) hypertension: Secondary | ICD-10-CM | POA: Diagnosis not present

## 2019-08-16 DIAGNOSIS — Z9181 History of falling: Secondary | ICD-10-CM | POA: Diagnosis not present

## 2020-02-12 DIAGNOSIS — E7800 Pure hypercholesterolemia, unspecified: Secondary | ICD-10-CM | POA: Diagnosis not present

## 2020-02-12 DIAGNOSIS — N183 Chronic kidney disease, stage 3 unspecified: Secondary | ICD-10-CM | POA: Diagnosis not present

## 2020-02-14 ENCOUNTER — Other Ambulatory Visit: Payer: Self-pay | Admitting: Nurse Practitioner

## 2020-02-14 DIAGNOSIS — M8580 Other specified disorders of bone density and structure, unspecified site: Secondary | ICD-10-CM

## 2020-02-14 DIAGNOSIS — N3281 Overactive bladder: Secondary | ICD-10-CM | POA: Diagnosis not present

## 2020-02-14 DIAGNOSIS — I10 Essential (primary) hypertension: Secondary | ICD-10-CM | POA: Diagnosis not present

## 2020-02-14 DIAGNOSIS — Z1231 Encounter for screening mammogram for malignant neoplasm of breast: Secondary | ICD-10-CM

## 2020-02-14 DIAGNOSIS — Z139 Encounter for screening, unspecified: Secondary | ICD-10-CM | POA: Diagnosis not present

## 2020-02-14 DIAGNOSIS — E7800 Pure hypercholesterolemia, unspecified: Secondary | ICD-10-CM | POA: Diagnosis not present

## 2020-02-14 DIAGNOSIS — N183 Chronic kidney disease, stage 3 unspecified: Secondary | ICD-10-CM | POA: Diagnosis not present

## 2020-05-01 ENCOUNTER — Other Ambulatory Visit: Payer: Self-pay

## 2020-05-01 ENCOUNTER — Ambulatory Visit
Admission: RE | Admit: 2020-05-01 | Discharge: 2020-05-01 | Disposition: A | Discharge status: Home / Self Care | Payer: Medicare Other | Source: Ambulatory Visit | Attending: Nurse Practitioner | Admitting: Nurse Practitioner

## 2020-05-01 DIAGNOSIS — M8580 Other specified disorders of bone density and structure, unspecified site: Secondary | ICD-10-CM

## 2020-05-01 DIAGNOSIS — Z1231 Encounter for screening mammogram for malignant neoplasm of breast: Secondary | ICD-10-CM

## 2020-05-16 DIAGNOSIS — M5030 Other cervical disc degeneration, unspecified cervical region: Secondary | ICD-10-CM | POA: Diagnosis not present

## 2020-05-16 DIAGNOSIS — M542 Cervicalgia: Secondary | ICD-10-CM | POA: Diagnosis not present

## 2020-05-16 DIAGNOSIS — M47812 Spondylosis without myelopathy or radiculopathy, cervical region: Secondary | ICD-10-CM | POA: Diagnosis not present

## 2020-05-30 DIAGNOSIS — M542 Cervicalgia: Secondary | ICD-10-CM | POA: Diagnosis not present

## 2020-06-17 DIAGNOSIS — H5203 Hypermetropia, bilateral: Secondary | ICD-10-CM | POA: Diagnosis not present

## 2020-06-17 DIAGNOSIS — H524 Presbyopia: Secondary | ICD-10-CM | POA: Diagnosis not present

## 2020-06-17 DIAGNOSIS — Z961 Presence of intraocular lens: Secondary | ICD-10-CM | POA: Diagnosis not present

## 2020-06-17 DIAGNOSIS — H52221 Regular astigmatism, right eye: Secondary | ICD-10-CM | POA: Diagnosis not present

## 2020-06-17 DIAGNOSIS — I10 Essential (primary) hypertension: Secondary | ICD-10-CM | POA: Diagnosis not present

## 2020-06-17 DIAGNOSIS — H11159 Pinguecula, unspecified eye: Secondary | ICD-10-CM | POA: Diagnosis not present

## 2020-06-24 DIAGNOSIS — R197 Diarrhea, unspecified: Secondary | ICD-10-CM | POA: Diagnosis not present

## 2020-06-24 DIAGNOSIS — Z1211 Encounter for screening for malignant neoplasm of colon: Secondary | ICD-10-CM | POA: Diagnosis not present

## 2020-06-25 DIAGNOSIS — R197 Diarrhea, unspecified: Secondary | ICD-10-CM | POA: Diagnosis not present

## 2020-06-25 DIAGNOSIS — R109 Unspecified abdominal pain: Secondary | ICD-10-CM | POA: Diagnosis not present

## 2020-07-10 DIAGNOSIS — M542 Cervicalgia: Secondary | ICD-10-CM | POA: Diagnosis not present

## 2020-08-18 DIAGNOSIS — E7800 Pure hypercholesterolemia, unspecified: Secondary | ICD-10-CM | POA: Diagnosis not present

## 2020-08-18 DIAGNOSIS — N183 Chronic kidney disease, stage 3 unspecified: Secondary | ICD-10-CM | POA: Diagnosis not present

## 2020-08-20 DIAGNOSIS — Z23 Encounter for immunization: Secondary | ICD-10-CM | POA: Diagnosis not present

## 2020-08-20 DIAGNOSIS — N3281 Overactive bladder: Secondary | ICD-10-CM | POA: Diagnosis not present

## 2020-08-20 DIAGNOSIS — Z9181 History of falling: Secondary | ICD-10-CM | POA: Diagnosis not present

## 2020-08-20 DIAGNOSIS — N184 Chronic kidney disease, stage 4 (severe): Secondary | ICD-10-CM | POA: Diagnosis not present

## 2020-08-20 DIAGNOSIS — E7800 Pure hypercholesterolemia, unspecified: Secondary | ICD-10-CM | POA: Diagnosis not present

## 2020-08-20 DIAGNOSIS — Z6823 Body mass index (BMI) 23.0-23.9, adult: Secondary | ICD-10-CM | POA: Diagnosis not present

## 2020-08-20 DIAGNOSIS — I10 Essential (primary) hypertension: Secondary | ICD-10-CM | POA: Diagnosis not present

## 2020-08-20 DIAGNOSIS — Z1331 Encounter for screening for depression: Secondary | ICD-10-CM | POA: Diagnosis not present

## 2020-08-21 DIAGNOSIS — N261 Atrophy of kidney (terminal): Secondary | ICD-10-CM | POA: Diagnosis not present

## 2020-08-21 DIAGNOSIS — N281 Cyst of kidney, acquired: Secondary | ICD-10-CM | POA: Diagnosis not present

## 2020-08-21 DIAGNOSIS — N184 Chronic kidney disease, stage 4 (severe): Secondary | ICD-10-CM | POA: Diagnosis not present

## 2020-08-29 DIAGNOSIS — R159 Full incontinence of feces: Secondary | ICD-10-CM | POA: Diagnosis not present

## 2020-08-29 DIAGNOSIS — R195 Other fecal abnormalities: Secondary | ICD-10-CM | POA: Diagnosis not present

## 2020-09-01 DIAGNOSIS — M542 Cervicalgia: Secondary | ICD-10-CM | POA: Diagnosis not present

## 2020-09-16 DIAGNOSIS — M542 Cervicalgia: Secondary | ICD-10-CM | POA: Diagnosis not present

## 2020-09-18 DIAGNOSIS — I10 Essential (primary) hypertension: Secondary | ICD-10-CM | POA: Diagnosis not present

## 2020-09-18 DIAGNOSIS — N1832 Chronic kidney disease, stage 3b: Secondary | ICD-10-CM | POA: Diagnosis not present

## 2020-09-29 DIAGNOSIS — Z6823 Body mass index (BMI) 23.0-23.9, adult: Secondary | ICD-10-CM | POA: Diagnosis not present

## 2020-09-29 DIAGNOSIS — I10 Essential (primary) hypertension: Secondary | ICD-10-CM | POA: Diagnosis not present

## 2020-09-29 DIAGNOSIS — N184 Chronic kidney disease, stage 4 (severe): Secondary | ICD-10-CM | POA: Diagnosis not present

## 2020-10-06 DIAGNOSIS — I10 Essential (primary) hypertension: Secondary | ICD-10-CM | POA: Diagnosis not present

## 2020-10-06 DIAGNOSIS — M542 Cervicalgia: Secondary | ICD-10-CM | POA: Diagnosis not present

## 2020-10-09 DIAGNOSIS — I10 Essential (primary) hypertension: Secondary | ICD-10-CM | POA: Diagnosis not present

## 2020-10-09 DIAGNOSIS — N1832 Chronic kidney disease, stage 3b: Secondary | ICD-10-CM | POA: Diagnosis not present

## 2020-10-13 DIAGNOSIS — Z1159 Encounter for screening for other viral diseases: Secondary | ICD-10-CM | POA: Diagnosis not present

## 2020-10-13 DIAGNOSIS — R195 Other fecal abnormalities: Secondary | ICD-10-CM | POA: Diagnosis not present

## 2020-10-13 DIAGNOSIS — R159 Full incontinence of feces: Secondary | ICD-10-CM | POA: Diagnosis not present

## 2020-10-16 DIAGNOSIS — N1832 Chronic kidney disease, stage 3b: Secondary | ICD-10-CM | POA: Diagnosis not present

## 2020-10-16 DIAGNOSIS — I10 Essential (primary) hypertension: Secondary | ICD-10-CM | POA: Diagnosis not present

## 2020-10-16 DIAGNOSIS — R809 Proteinuria, unspecified: Secondary | ICD-10-CM | POA: Diagnosis not present

## 2020-12-02 HISTORY — PX: COLONOSCOPY: SHX174

## 2020-12-11 DIAGNOSIS — I10 Essential (primary) hypertension: Secondary | ICD-10-CM | POA: Diagnosis not present

## 2020-12-11 DIAGNOSIS — N1832 Chronic kidney disease, stage 3b: Secondary | ICD-10-CM | POA: Diagnosis not present

## 2020-12-11 DIAGNOSIS — R809 Proteinuria, unspecified: Secondary | ICD-10-CM | POA: Diagnosis not present

## 2020-12-18 DIAGNOSIS — N184 Chronic kidney disease, stage 4 (severe): Secondary | ICD-10-CM | POA: Diagnosis not present

## 2020-12-18 DIAGNOSIS — R809 Proteinuria, unspecified: Secondary | ICD-10-CM | POA: Diagnosis not present

## 2020-12-18 DIAGNOSIS — D631 Anemia in chronic kidney disease: Secondary | ICD-10-CM | POA: Diagnosis not present

## 2020-12-18 DIAGNOSIS — I10 Essential (primary) hypertension: Secondary | ICD-10-CM | POA: Diagnosis not present

## 2020-12-22 DIAGNOSIS — Z01812 Encounter for preprocedural laboratory examination: Secondary | ICD-10-CM | POA: Diagnosis not present

## 2020-12-25 DIAGNOSIS — K648 Other hemorrhoids: Secondary | ICD-10-CM | POA: Diagnosis not present

## 2020-12-25 DIAGNOSIS — K621 Rectal polyp: Secondary | ICD-10-CM | POA: Diagnosis not present

## 2020-12-25 DIAGNOSIS — R194 Change in bowel habit: Secondary | ICD-10-CM | POA: Diagnosis not present

## 2020-12-25 DIAGNOSIS — D120 Benign neoplasm of cecum: Secondary | ICD-10-CM | POA: Diagnosis not present

## 2020-12-25 DIAGNOSIS — K644 Residual hemorrhoidal skin tags: Secondary | ICD-10-CM | POA: Diagnosis not present

## 2020-12-25 DIAGNOSIS — R159 Full incontinence of feces: Secondary | ICD-10-CM | POA: Diagnosis not present

## 2020-12-25 DIAGNOSIS — K52831 Collagenous colitis: Secondary | ICD-10-CM | POA: Diagnosis not present

## 2020-12-25 DIAGNOSIS — Q438 Other specified congenital malformations of intestine: Secondary | ICD-10-CM | POA: Diagnosis not present

## 2020-12-30 DIAGNOSIS — K621 Rectal polyp: Secondary | ICD-10-CM | POA: Diagnosis not present

## 2020-12-30 DIAGNOSIS — D120 Benign neoplasm of cecum: Secondary | ICD-10-CM | POA: Diagnosis not present

## 2020-12-30 DIAGNOSIS — K52831 Collagenous colitis: Secondary | ICD-10-CM | POA: Diagnosis not present

## 2021-02-02 DIAGNOSIS — I10 Essential (primary) hypertension: Secondary | ICD-10-CM | POA: Diagnosis not present

## 2021-02-02 DIAGNOSIS — M542 Cervicalgia: Secondary | ICD-10-CM | POA: Diagnosis not present

## 2021-02-11 DIAGNOSIS — M542 Cervicalgia: Secondary | ICD-10-CM | POA: Diagnosis not present

## 2021-02-12 DIAGNOSIS — R809 Proteinuria, unspecified: Secondary | ICD-10-CM | POA: Diagnosis not present

## 2021-02-12 DIAGNOSIS — I10 Essential (primary) hypertension: Secondary | ICD-10-CM | POA: Diagnosis not present

## 2021-02-12 DIAGNOSIS — D631 Anemia in chronic kidney disease: Secondary | ICD-10-CM | POA: Diagnosis not present

## 2021-02-12 DIAGNOSIS — N184 Chronic kidney disease, stage 4 (severe): Secondary | ICD-10-CM | POA: Diagnosis not present

## 2021-02-18 DIAGNOSIS — M542 Cervicalgia: Secondary | ICD-10-CM | POA: Diagnosis not present

## 2021-02-19 DIAGNOSIS — E875 Hyperkalemia: Secondary | ICD-10-CM | POA: Diagnosis not present

## 2021-02-19 DIAGNOSIS — N184 Chronic kidney disease, stage 4 (severe): Secondary | ICD-10-CM | POA: Diagnosis not present

## 2021-02-19 DIAGNOSIS — I10 Essential (primary) hypertension: Secondary | ICD-10-CM | POA: Diagnosis not present

## 2021-02-19 DIAGNOSIS — D631 Anemia in chronic kidney disease: Secondary | ICD-10-CM | POA: Diagnosis not present

## 2021-02-23 DIAGNOSIS — M542 Cervicalgia: Secondary | ICD-10-CM | POA: Diagnosis not present

## 2021-02-25 DIAGNOSIS — M542 Cervicalgia: Secondary | ICD-10-CM | POA: Diagnosis not present

## 2021-03-02 DIAGNOSIS — M542 Cervicalgia: Secondary | ICD-10-CM | POA: Diagnosis not present

## 2021-03-05 DIAGNOSIS — M542 Cervicalgia: Secondary | ICD-10-CM | POA: Diagnosis not present

## 2021-03-09 DIAGNOSIS — M542 Cervicalgia: Secondary | ICD-10-CM | POA: Diagnosis not present

## 2021-03-11 DIAGNOSIS — K52831 Collagenous colitis: Secondary | ICD-10-CM | POA: Diagnosis not present

## 2021-03-11 DIAGNOSIS — R195 Other fecal abnormalities: Secondary | ICD-10-CM | POA: Diagnosis not present

## 2021-03-23 DIAGNOSIS — I10 Essential (primary) hypertension: Secondary | ICD-10-CM | POA: Diagnosis not present

## 2021-03-23 DIAGNOSIS — M542 Cervicalgia: Secondary | ICD-10-CM | POA: Diagnosis not present

## 2021-03-31 DIAGNOSIS — E781 Pure hyperglyceridemia: Secondary | ICD-10-CM | POA: Diagnosis not present

## 2021-03-31 DIAGNOSIS — Z139 Encounter for screening, unspecified: Secondary | ICD-10-CM | POA: Diagnosis not present

## 2021-03-31 DIAGNOSIS — Z6823 Body mass index (BMI) 23.0-23.9, adult: Secondary | ICD-10-CM | POA: Diagnosis not present

## 2021-03-31 DIAGNOSIS — I10 Essential (primary) hypertension: Secondary | ICD-10-CM | POA: Diagnosis not present

## 2021-03-31 DIAGNOSIS — N3281 Overactive bladder: Secondary | ICD-10-CM | POA: Diagnosis not present

## 2021-03-31 DIAGNOSIS — N184 Chronic kidney disease, stage 4 (severe): Secondary | ICD-10-CM | POA: Diagnosis not present

## 2021-04-02 DIAGNOSIS — M542 Cervicalgia: Secondary | ICD-10-CM | POA: Diagnosis not present

## 2021-04-02 DIAGNOSIS — M47812 Spondylosis without myelopathy or radiculopathy, cervical region: Secondary | ICD-10-CM | POA: Diagnosis not present

## 2021-04-15 ENCOUNTER — Ambulatory Visit (INDEPENDENT_AMBULATORY_CARE_PROVIDER_SITE_OTHER): Payer: Medicare Other | Admitting: Podiatry

## 2021-04-15 ENCOUNTER — Encounter: Payer: Self-pay | Admitting: Podiatry

## 2021-04-15 ENCOUNTER — Other Ambulatory Visit: Payer: Self-pay

## 2021-04-15 DIAGNOSIS — L600 Ingrowing nail: Secondary | ICD-10-CM

## 2021-04-15 DIAGNOSIS — M2041 Other hammer toe(s) (acquired), right foot: Secondary | ICD-10-CM

## 2021-04-15 NOTE — Patient Instructions (Signed)

## 2021-04-15 NOTE — Progress Notes (Signed)
Subjective:   Patient ID: Rita Whitaker, female   DOB: 75 y.o.   MRN: [id number]   HPI Patient presents stating she is got a painful ingrown toenail for several months and also states that she is getting digital deformities with rotational component of the toes right foot over left foot.  States its been going on and getting worse   ROS      Objective:  Physical Exam  Neurovascular status intact with incurvated right second nail medial border with irritation of the tissue with digital deformities rotational component lesser digits right foot     Assessment:  Ingrown toenail deformity right second toe medial border with digital deformity hammertoe deformity right     Plan:  H&P reviewed condition sterile prep and went ahead today and removed the medial border of the right second nail exposed the matrix and applied phenol 3 applications 30 seconds followed by alcohol by sterile dressing.  She signed consent form understanding risk prior to the procedure and I discussed her hammertoe deformity do not recommend current correction but may be necessary at 1 point in future

## 2021-04-21 ENCOUNTER — Other Ambulatory Visit: Payer: Self-pay | Admitting: Neurosurgery

## 2021-04-22 ENCOUNTER — Telehealth: Payer: Self-pay | Admitting: *Deleted

## 2021-04-22 NOTE — Telephone Encounter (Signed)
Patient is calling for advise on how long to should she keep soaking her procedural toe.  Returned call and instructed that she should continue to soak until draining has stopped.  She verbalized understanding but said that the toe looks fine expect after soaking and then it appears a little soft. I asked that she call back with any other questions or concerns.

## 2021-04-24 NOTE — Pre-Procedure Instructions (Signed)
Rita Whitaker  04/24/2021     Your procedure is scheduled on Wed., April 29, 2021 from 12:45PM-4:03PM.  Report to [facility] Entrance "A" at 10:45AM  Call this number if you have problems the morning of surgery:  [phone]   Remember:  Do not eat or drink after midnight on June 28th    Take these medicines the morning of surgery with A SIP OF WATER: Famotidine (PEPCID)  Solifenacin (VESICARE)  If Needed: Acetaminophen (TYLENOL) Sodium chloride (OCEAN)   As of today, STOP taking all Aspirin (unless instructed by your doctor) and Other Aspirin containing products, Vitamins, Fish oils, and Herbal medications. Also stop all NSAIDS i.e. Advil, Ibuprofen, Motrin, Aleve, Anaprox, Naproxen, BC, Goody Powders, and all Supplements.    No Smoking of any kind, Tobacco/Vaping, or Alcohol products 24 hours prior to your procedure. If you use a Cpap at night, you may bring all equipment for your overnight stay.    Day of Surgery:  Do not wear jewelry, make-up. Do Not wear nail polish, gel polish, artificial nails, or any other type of covering on  natural nails including finger and toenails. If patients have artificial nails, gel coating, etc. that need to be removed by a nail salon please have this removed prior to surgery or surgery may need to be canceled/delayed if the surgeon/ anesthesia feels like the patient is unable to be adequately monitored.  Do not wear lotions, powders, or perfumes, or deodorant.  Do not shave 48 hours prior to surgery.    Do not bring valuables to the hospital.  [facility] is not responsible for any belongings or valuables.  Contacts, dentures or bridgework may not be worn into surgery.    For patients admitted to the hospital, discharge time will be determined by your treatment team.  Patients discharged the day of surgery will not be allowed to drive home, and someone age 77 and over needs to stay with them for 24 hours.   Special  instructions:  [facility]- Preparing For Surgery  Before surgery, you can play an important role. Because skin is not sterile, your skin needs to be as free of germs as possible. You can reduce the number of germs on your skin by washing with CHG (chlorahexidine gluconate) Soap before surgery.  CHG is an antiseptic cleaner which kills germs and bonds with the skin to continue killing germs even after washing.    Oral Hygiene is also important to reduce your risk of infection.  Remember - BRUSH YOUR TEETH THE MORNING OF SURGERY WITH YOUR REGULAR TOOTHPASTE  Please do not use if you have an allergy to CHG or antibacterial soaps. If your skin becomes reddened/irritated stop using the CHG.  Do not shave (including legs and underarms) for at least 48 hours prior to first CHG shower. It is OK to shave your face.  Please follow these instructions carefully.   Shower the NIGHT BEFORE SURGERY and the MORNING OF SURGERY with CHG.   If you chose to wash your hair, wash your hair first as usual with your normal shampoo.  After you shampoo, rinse your hair and body thoroughly to remove the shampoo.  Use CHG as you would any other liquid soap. You can apply CHG directly to the skin and wash gently with a scrungie or a clean washcloth.   Apply the CHG Soap to your body ONLY FROM THE NECK DOWN.  Do not use on open wounds or open sores. Avoid  contact with your eyes, ears, mouth and genitals (private parts). Wash Face and genitals (private parts)  with your normal soap.  Wash thoroughly, paying special attention to the area where your surgery will be performed.  Thoroughly rinse your body with warm water from the neck down.  DO NOT shower/wash with your normal soap after using and rinsing off the CHG Soap.  Pat yourself dry with a CLEAN TOWEL.  Wear CLEAN PAJAMAS to bed the night before surgery, wear comfortable clothes the morning of surgery  Place CLEAN SHEETS on your bed the night of your first  shower and DO NOT SLEEP WITH PETS.  Reminders: Do not apply any deodorants/lotions.  Please wear clean clothes to the hospital/surgery center.   Remember to brush your teeth WITH YOUR REGULAR TOOTHPASTE.  Please read over the following fact sheets that you were given.

## 2021-04-27 ENCOUNTER — Other Ambulatory Visit: Payer: Self-pay

## 2021-04-27 ENCOUNTER — Encounter (HOSPITAL_COMMUNITY)
Admission: RE | Admit: 2021-04-27 | Discharge: 2021-04-27 | Disposition: A | Discharge status: Home / Self Care | Payer: Medicare Other | Source: Ambulatory Visit | Attending: Neurosurgery | Admitting: Neurosurgery

## 2021-04-27 ENCOUNTER — Encounter (HOSPITAL_COMMUNITY): Payer: Self-pay

## 2021-04-27 DIAGNOSIS — N184 Chronic kidney disease, stage 4 (severe): Secondary | ICD-10-CM | POA: Insufficient documentation

## 2021-04-27 DIAGNOSIS — M542 Cervicalgia: Secondary | ICD-10-CM | POA: Diagnosis not present

## 2021-04-27 DIAGNOSIS — Z79899 Other long term (current) drug therapy: Secondary | ICD-10-CM | POA: Diagnosis not present

## 2021-04-27 DIAGNOSIS — I110 Hypertensive heart disease with heart failure: Secondary | ICD-10-CM | POA: Diagnosis not present

## 2021-04-27 DIAGNOSIS — Z01818 Encounter for other preprocedural examination: Secondary | ICD-10-CM | POA: Insufficient documentation

## 2021-04-27 DIAGNOSIS — Z20822 Contact with and (suspected) exposure to covid-19: Secondary | ICD-10-CM | POA: Insufficient documentation

## 2021-04-27 HISTORY — DX: Essential (primary) hypertension: I10

## 2021-04-27 HISTORY — DX: Anemia, unspecified: D64.9

## 2021-04-27 HISTORY — DX: Gastro-esophageal reflux disease without esophagitis: K21.9

## 2021-04-27 HISTORY — DX: Unspecified osteoarthritis, unspecified site: M19.90

## 2021-04-27 HISTORY — DX: Chronic kidney disease, unspecified: N18.9

## 2021-04-27 LAB — SURGICAL PCR SCREEN
MRSA, PCR: NEGATIVE
Staphylococcus aureus: NEGATIVE

## 2021-04-27 LAB — CBC
HCT: 34.5 % — ABNORMAL LOW (ref 36.0–46.0)
Hemoglobin: 11.2 g/dL — ABNORMAL LOW (ref 12.0–15.0)
MCH: 31.2 pg (ref 26.0–34.0)
MCHC: 32.5 g/dL (ref 30.0–36.0)
MCV: 96.1 fL (ref 80.0–100.0)
Platelets: 339 10*3/uL (ref 150–400)
RBC: 3.59 MIL/uL — ABNORMAL LOW (ref 3.87–5.11)
RDW: 12.8 % (ref 11.5–15.5)
WBC: 6.5 10*3/uL (ref 4.0–10.5)
nRBC: 0 % (ref 0.0–0.2)

## 2021-04-27 LAB — BASIC METABOLIC PANEL
Anion gap: 8 (ref 5–15)
BUN: 28 mg/dL — ABNORMAL HIGH (ref 8–23)
CO2: 25 mmol/L (ref 22–32)
Calcium: 9.7 mg/dL (ref 8.9–10.3)
Chloride: 104 mmol/L (ref 98–111)
Creatinine, Ser: 1.7 mg/dL — ABNORMAL HIGH (ref 0.44–1.00)
GFR, Estimated: 31 mL/min — ABNORMAL LOW (ref >60)
Glucose, Bld: 88 mg/dL (ref 70–99)
Potassium: 4.4 mmol/L (ref 3.5–5.1)
Sodium: 137 mmol/L (ref 135–145)

## 2021-04-27 LAB — TYPE AND SCREEN
ABO/RH(D): O POS
Antibody Screen: NEGATIVE

## 2021-04-27 LAB — SARS CORONAVIRUS 2 (TAT 6-24 HRS): SARS Coronavirus 2: NEGATIVE

## 2021-04-27 NOTE — Progress Notes (Addendum)
PCP - Charlott Holler, NP Cardiologist - Denies Nephrologist- Lyla Son, MD GI- Otis Brace, MD  PPM/ICD - Denies  Chest x-ray - N/A EKG - 04/27/21 Stress Test - Per pt, > 10 years ago, results were normal. ECHO - Denies Cardiac Cath - Denies  Sleep Study - Denies  Pt is not diabetic.  Blood Thinner Instructions: N/A Aspirin Instructions: N/A  ERAS Protcol - N/A PRE-SURGERY Ensure or G2- N/A  COVID TEST- 04/27/21   Anesthesia review: Yes, elevated creat. & BUN; Renal hx.  Patient denies shortness of breath, fever, cough and chest pain at PAT appointment   All instructions explained to the patient, with a verbal understanding of the material. Patient agrees to go over the instructions while at home for a better understanding. Patient also instructed to self quarantine after being tested for COVID-19. The opportunity to ask questions was provided.

## 2021-04-28 NOTE — Progress Notes (Signed)
Anesthesia Chart Review:   Case: [id number] Date/Time: 04/29/21 1230   Procedure: Cervical 4-5 Cervical 5-6 Anterior cervical decompression/discectomy/fusion - 3C/RM 18   Anesthesia type: General   Pre-op diagnosis: Cervicalgia   Location: [facility] OR ROOM 18 / [facility]   Surgeons: Ashok Pall, MD       DISCUSSION: Pt is 75 years old with hx HTN, CKD stage IV  - Cr 1.7 at pre-admission testing. This is consistent with prior results; Cr ranged 1.83-1.98 over last 6 months in care everywhere)   VS: BP (!) 175/80   Pulse (!) 103   Temp 36.7 C (Oral)   Resp 18   Ht 5' (1.524 m)   Wt 55.2 kg   SpO2 100%   BMI 23.75 kg/m   PROVIDERS: - PCP is Philmore Pali, NP - Nephrologis is Lyla Son, MD (notes in care everywhere)    LABS: Labs reviewed: Acceptable for surgery. - Cr 1.7. This is consistent with prior results; Cr ranged 1.83-1.98 over last 6 months in care everywhere)  (all labs ordered are listed, but only abnormal results are displayed)  Labs Reviewed  BASIC METABOLIC PANEL - Abnormal; Notable for the following components:      Result Value   BUN 28 (*)    Creatinine, Ser 1.70 (*)    GFR, Estimated 31 (*)    All other components within normal limits  CBC - Abnormal; Notable for the following components:   RBC 3.59 (*)    Hemoglobin 11.2 (*)    HCT 34.5 (*)    All other components within normal limits  SARS CORONAVIRUS 2 (TAT 6-24 HRS)  SURGICAL PCR SCREEN  TYPE AND SCREEN    EKG 04/27/21: NSR   CV: N/A  Past Medical History:  Diagnosis Date   Anemia    Arthritis    neck, foot, hands   Chronic kidney disease    stage 4   GERD (gastroesophageal reflux disease)    Hypertension     Past Surgical History:  Procedure Laterality Date   ABDOMINAL HYSTERECTOMY     CATARACT EXTRACTION Bilateral    COLONOSCOPY  12/2020   EYE SURGERY Bilateral    cataract removal    MEDICATIONS:  acetaminophen (TYLENOL) 650 MG CR tablet   colestipol (COLESTID) 1 g tablet    famotidine (PEPCID) 20 MG tablet   fenofibrate micronized (LOFIBRA) 67 MG capsule   ferrous gluconate (FERGON) 240 (27 FE) MG tablet   lisinopril-hydrochlorothiazide (ZESTORETIC) 20-25 MG tablet   Multiple Vitamins-Minerals (MULTIVITAMIN ADULTS 50+ PO)   sodium chloride (OCEAN) 0.65 % SOLN nasal spray   solifenacin (VESICARE) 5 MG tablet   No current facility-administered medications for this encounter.    If no changes, I anticipate pt can proceed with surgery as scheduled.   Willeen Cass, PhD, FNP-BC [facility]/Anesthesiology Phone: [phone] 04/28/2021 10:57 AM

## 2021-04-28 NOTE — Anesthesia Preprocedure Evaluation (Addendum)
Anesthesia Evaluation  Patient identified by MRN, date of birth, ID band Patient awake    Reviewed: Allergy & Precautions, H&P , NPO status , Patient's Chart, lab work & pertinent test results  Airway Mallampati: II   Neck ROM: full    Dental no notable dental hx.    Pulmonary neg pulmonary ROS,    breath sounds clear to auscultation       Cardiovascular hypertension,  Rhythm:regular Rate:Normal     Neuro/Psych    GI/Hepatic GERD  ,  Endo/Other    Renal/GU Renal InsufficiencyRenal disease     Musculoskeletal  (+) Arthritis ,   Abdominal   Peds  Hematology   Anesthesia Other Findings   Reproductive/Obstetrics                           Anesthesia Physical Anesthesia Plan  ASA: 3  Anesthesia Plan: General   Post-op Pain Management:    Induction: Intravenous  PONV Risk Score and Plan: 3 and Ondansetron, Dexamethasone and Treatment may vary due to age or medical condition  Airway Management Planned: Oral ETT  Additional Equipment:   Intra-op Plan:   Post-operative Plan: Extubation in OR  Informed Consent: I have reviewed the patients History and Physical, chart, labs and discussed the procedure including the risks, benefits and alternatives for the proposed anesthesia with the patient or authorized representative who has indicated his/her understanding and acceptance.     Dental advisory given  Plan Discussed with: CRNA, Anesthesiologist and Surgeon  Anesthesia Plan Comments: (See APP note by Durel Salts, FNP )       Anesthesia Quick Evaluation

## 2021-04-29 ENCOUNTER — Inpatient Hospital Stay (HOSPITAL_COMMUNITY): Payer: Medicare Other | Admitting: Emergency Medicine

## 2021-04-29 ENCOUNTER — Encounter (HOSPITAL_COMMUNITY): Payer: Self-pay | Admitting: Neurosurgery

## 2021-04-29 ENCOUNTER — Encounter (HOSPITAL_COMMUNITY)
Admission: RE | Disposition: A | Discharge status: Home / Self Care | Payer: Self-pay | Source: Home / Self Care | Attending: Neurosurgery

## 2021-04-29 ENCOUNTER — Ambulatory Visit (HOSPITAL_COMMUNITY)
Admission: RE | Admit: 2021-04-29 | Discharge: 2021-04-30 | Disposition: A | Discharge status: Home / Self Care | Payer: Medicare Other | Attending: Neurosurgery | Admitting: Neurosurgery

## 2021-04-29 ENCOUNTER — Inpatient Hospital Stay (HOSPITAL_COMMUNITY): Payer: Medicare Other

## 2021-04-29 DIAGNOSIS — Z9889 Other specified postprocedural states: Secondary | ICD-10-CM | POA: Diagnosis not present

## 2021-04-29 DIAGNOSIS — M4312 Spondylolisthesis, cervical region: Secondary | ICD-10-CM | POA: Diagnosis present

## 2021-04-29 DIAGNOSIS — Z881 Allergy status to other antibiotic agents status: Secondary | ICD-10-CM | POA: Diagnosis not present

## 2021-04-29 DIAGNOSIS — Z882 Allergy status to sulfonamides status: Secondary | ICD-10-CM | POA: Diagnosis not present

## 2021-04-29 DIAGNOSIS — Z91040 Latex allergy status: Secondary | ICD-10-CM | POA: Diagnosis not present

## 2021-04-29 DIAGNOSIS — M6281 Muscle weakness (generalized): Secondary | ICD-10-CM | POA: Diagnosis not present

## 2021-04-29 DIAGNOSIS — M4322 Fusion of spine, cervical region: Secondary | ICD-10-CM | POA: Diagnosis not present

## 2021-04-29 DIAGNOSIS — Z981 Arthrodesis status: Secondary | ICD-10-CM | POA: Diagnosis not present

## 2021-04-29 DIAGNOSIS — Z885 Allergy status to narcotic agent status: Secondary | ICD-10-CM | POA: Insufficient documentation

## 2021-04-29 DIAGNOSIS — N184 Chronic kidney disease, stage 4 (severe): Secondary | ICD-10-CM | POA: Diagnosis not present

## 2021-04-29 DIAGNOSIS — K219 Gastro-esophageal reflux disease without esophagitis: Secondary | ICD-10-CM | POA: Diagnosis not present

## 2021-04-29 DIAGNOSIS — Z419 Encounter for procedure for purposes other than remedying health state, unspecified: Secondary | ICD-10-CM

## 2021-04-29 DIAGNOSIS — I129 Hypertensive chronic kidney disease with stage 1 through stage 4 chronic kidney disease, or unspecified chronic kidney disease: Secondary | ICD-10-CM | POA: Diagnosis not present

## 2021-04-29 HISTORY — PX: ANTERIOR CERVICAL DECOMP/DISCECTOMY FUSION: SHX1161

## 2021-04-29 LAB — ABO/RH: ABO/RH(D): O POS

## 2021-04-29 SURGERY — ANTERIOR CERVICAL DECOMPRESSION/DISCECTOMY FUSION 2 LEVELS
Anesthesia: General | Site: Spine Cervical

## 2021-04-29 MED ORDER — FENOFIBRATE 54 MG PO TABS
54.0000 mg | ORAL_TABLET | Freq: Every day | ORAL | Status: DC
  Filled 2021-04-29: qty 1

## 2021-04-29 MED ORDER — ROCURONIUM BROMIDE 10 MG/ML (PF) SYRINGE
PREFILLED_SYRINGE | INTRAVENOUS | Status: AC
  Filled 2021-04-29: qty 10

## 2021-04-29 MED ORDER — HYDROCODONE-ACETAMINOPHEN 7.5-325 MG PO TABS: 1.0000 | ORAL_TABLET | Freq: Four times a day (QID) | ORAL | Status: DC

## 2021-04-29 MED ORDER — CEFAZOLIN SODIUM-DEXTROSE 2-4 GM/100ML-% IV SOLN
2.0000 g | INTRAVENOUS | Status: AC
  Administered 2021-04-29: 2 g via INTRAVENOUS
  Filled 2021-04-29: qty 100

## 2021-04-29 MED ORDER — ONDANSETRON HCL 4 MG/2ML IJ SOLN: 4.0000 mg | Freq: Four times a day (QID) | INTRAMUSCULAR | Status: DC | PRN

## 2021-04-29 MED ORDER — HYDROCODONE-ACETAMINOPHEN 5-325 MG PO TABS: 1.0000 | ORAL_TABLET | ORAL | Status: DC | PRN

## 2021-04-29 MED ORDER — FAMOTIDINE 20 MG PO TABS
20.0000 mg | ORAL_TABLET | Freq: Every day | ORAL | Status: DC
  Administered 2021-04-30: 20 mg via ORAL
  Filled 2021-04-29: qty 1

## 2021-04-29 MED ORDER — CHLORHEXIDINE GLUCONATE CLOTH 2 % EX PADS: 6.0000 | MEDICATED_PAD | Freq: Once | CUTANEOUS | Status: DC

## 2021-04-29 MED ORDER — PROPOFOL 10 MG/ML IV BOLUS
INTRAVENOUS | Status: DC | PRN
  Administered 2021-04-29: 90 mg via INTRAVENOUS

## 2021-04-29 MED ORDER — OXYCODONE HCL 5 MG PO TABS: 5.0000 mg | ORAL_TABLET | Freq: Once | ORAL | Status: DC | PRN

## 2021-04-29 MED ORDER — DOCUSATE SODIUM 100 MG PO CAPS
100.0000 mg | ORAL_CAPSULE | Freq: Two times a day (BID) | ORAL | Status: DC
  Filled 2021-04-29: qty 1

## 2021-04-29 MED ORDER — HYDROCODONE-ACETAMINOPHEN 5-325 MG PO TABS
1.0000 | ORAL_TABLET | ORAL | Status: DC | PRN
Start: 2021-04-30 — End: 2021-04-30

## 2021-04-29 MED ORDER — OXYCODONE HCL 5 MG/5ML PO SOLN: 5.0000 mg | Freq: Once | ORAL | Status: DC | PRN

## 2021-04-29 MED ORDER — PHENOL 1.4 % MT LIQD: 1.0000 | OROMUCOSAL | Status: DC | PRN

## 2021-04-29 MED ORDER — MENTHOL 3 MG MT LOZG: 1.0000 | LOZENGE | OROMUCOSAL | Status: DC | PRN

## 2021-04-29 MED ORDER — PHENYLEPHRINE HCL-NACL 10-0.9 MG/250ML-% IV SOLN
INTRAVENOUS | Status: DC | PRN
  Administered 2021-04-29: 30 ug/min via INTRAVENOUS

## 2021-04-29 MED ORDER — ORAL CARE MOUTH RINSE: 15.0000 mL | Freq: Once | OROMUCOSAL | Status: AC

## 2021-04-29 MED ORDER — ACETAMINOPHEN 500 MG PO TABS
1000.0000 mg | ORAL_TABLET | Freq: Four times a day (QID) | ORAL | Status: DC
  Administered 2021-04-30 (×3): 1000 mg via ORAL
  Filled 2021-04-29 (×3): qty 2

## 2021-04-29 MED ORDER — SALINE SPRAY 0.65 % NA SOLN: 1.0000 | NASAL | Status: DC | PRN

## 2021-04-29 MED ORDER — DIAZEPAM 5 MG PO TABS: 5.0000 mg | ORAL_TABLET | Freq: Four times a day (QID) | ORAL | Status: DC | PRN

## 2021-04-29 MED ORDER — FENTANYL CITRATE (PF) 100 MCG/2ML IJ SOLN
INTRAMUSCULAR | Status: AC
  Filled 2021-04-29: qty 2

## 2021-04-29 MED ORDER — FENTANYL CITRATE (PF) 250 MCG/5ML IJ SOLN
INTRAMUSCULAR | Status: AC
  Filled 2021-04-29: qty 5

## 2021-04-29 MED ORDER — MORPHINE SULFATE (PF) 2 MG/ML IV SOLN: 2.0000 mg | INTRAVENOUS | Status: DC | PRN

## 2021-04-29 MED ORDER — LISINOPRIL-HYDROCHLOROTHIAZIDE 20-25 MG PO TABS: 1.0000 | ORAL_TABLET | Freq: Every day | ORAL | Status: DC

## 2021-04-29 MED ORDER — LACTATED RINGERS IV SOLN: INTRAVENOUS | Status: DC

## 2021-04-29 MED ORDER — LIDOCAINE 2% (20 MG/ML) 5 ML SYRINGE
INTRAMUSCULAR | Status: DC | PRN
  Administered 2021-04-29: 40 mg via INTRAVENOUS

## 2021-04-29 MED ORDER — THROMBIN 5000 UNITS EX SOLR
CUTANEOUS | Status: AC
  Filled 2021-04-29: qty 10000

## 2021-04-29 MED ORDER — CHLORHEXIDINE GLUCONATE 0.12 % MT SOLN
15.0000 mL | Freq: Once | OROMUCOSAL | Status: AC
  Administered 2021-04-29: 15 mL via OROMUCOSAL
  Filled 2021-04-29: qty 15

## 2021-04-29 MED ORDER — SUGAMMADEX SODIUM 200 MG/2ML IV SOLN
INTRAVENOUS | Status: DC | PRN
  Administered 2021-04-29: 120 mg via INTRAVENOUS

## 2021-04-29 MED ORDER — DARIFENACIN HYDROBROMIDE ER 7.5 MG PO TB24
7.5000 mg | ORAL_TABLET | Freq: Every day | ORAL | Status: DC
  Administered 2021-04-30: 7.5 mg via ORAL
  Filled 2021-04-29: qty 1

## 2021-04-29 MED ORDER — LISINOPRIL 20 MG PO TABS
20.0000 mg | ORAL_TABLET | Freq: Every day | ORAL | Status: DC
  Administered 2021-04-29: 20 mg via ORAL
  Filled 2021-04-29: qty 1

## 2021-04-29 MED ORDER — ROCURONIUM BROMIDE 10 MG/ML (PF) SYRINGE
PREFILLED_SYRINGE | INTRAVENOUS | Status: DC | PRN
  Administered 2021-04-29: 30 mg via INTRAVENOUS
  Administered 2021-04-29: 50 mg via INTRAVENOUS

## 2021-04-29 MED ORDER — POTASSIUM CHLORIDE IN NACL 20-0.9 MEQ/L-% IV SOLN
INTRAVENOUS | Status: DC
  Filled 2021-04-29: qty 1000

## 2021-04-29 MED ORDER — ONDANSETRON HCL 4 MG/2ML IJ SOLN: INTRAMUSCULAR | Status: DC | PRN

## 2021-04-29 MED ORDER — FENTANYL CITRATE (PF) 250 MCG/5ML IJ SOLN
INTRAMUSCULAR | Status: DC | PRN
  Administered 2021-04-29: 100 ug via INTRAVENOUS

## 2021-04-29 MED ORDER — FERROUS GLUCONATE 324 (38 FE) MG PO TABS
324.0000 mg | ORAL_TABLET | Freq: Two times a day (BID) | ORAL | Status: DC
  Administered 2021-04-30: 324 mg via ORAL
  Filled 2021-04-29 (×2): qty 1

## 2021-04-29 MED ORDER — DEXAMETHASONE SODIUM PHOSPHATE 10 MG/ML IJ SOLN
INTRAMUSCULAR | Status: DC | PRN
  Administered 2021-04-29: 10 mg via INTRAVENOUS

## 2021-04-29 MED ORDER — FENTANYL CITRATE (PF) 100 MCG/2ML IJ SOLN
25.0000 ug | INTRAMUSCULAR | Status: DC | PRN
  Administered 2021-04-29: 25 ug via INTRAVENOUS

## 2021-04-29 MED ORDER — ACETAMINOPHEN 325 MG PO TABS: 650.0000 mg | ORAL_TABLET | ORAL | Status: DC | PRN

## 2021-04-29 MED ORDER — LIDOCAINE-EPINEPHRINE 1 %-1:100000 IJ SOLN
INTRAMUSCULAR | Status: AC
  Filled 2021-04-29: qty 1

## 2021-04-29 MED ORDER — 0.9 % SODIUM CHLORIDE (POUR BTL) OPTIME
TOPICAL | Status: DC | PRN
  Administered 2021-04-29: 1000 mL

## 2021-04-29 MED ORDER — ACETAMINOPHEN 650 MG RE SUPP: 650.0000 mg | RECTAL | Status: DC | PRN

## 2021-04-29 MED ORDER — LIDOCAINE-EPINEPHRINE 1 %-1:100000 IJ SOLN
INTRAMUSCULAR | Status: DC | PRN
  Administered 2021-04-29: 10 mL

## 2021-04-29 MED ORDER — SODIUM CHLORIDE 0.9 % IV SOLN: 250.0000 mL | INTRAVENOUS | Status: DC

## 2021-04-29 MED ORDER — ONDANSETRON HCL 4 MG PO TABS: 4.0000 mg | ORAL_TABLET | Freq: Four times a day (QID) | ORAL | Status: DC | PRN

## 2021-04-29 MED ORDER — HYDROCHLOROTHIAZIDE 25 MG PO TABS
25.0000 mg | ORAL_TABLET | Freq: Every day | ORAL | Status: DC
  Administered 2021-04-29: 25 mg via ORAL
  Filled 2021-04-29: qty 1

## 2021-04-29 MED ORDER — COLESTIPOL HCL 1 G PO TABS
1.0000 g | ORAL_TABLET | Freq: Every evening | ORAL | Status: DC | PRN
  Filled 2021-04-29: qty 1

## 2021-04-29 MED ORDER — SODIUM CHLORIDE 0.9% FLUSH
3.0000 mL | Freq: Two times a day (BID) | INTRAVENOUS | Status: DC
  Administered 2021-04-29 – 2021-04-30 (×2): 3 mL via INTRAVENOUS

## 2021-04-29 MED ORDER — SODIUM CHLORIDE 0.9% FLUSH: 3.0000 mL | INTRAVENOUS | Status: DC | PRN

## 2021-04-29 MED ORDER — HYDROCODONE-ACETAMINOPHEN 5-325 MG PO TABS: 2.0000 | ORAL_TABLET | ORAL | Status: DC | PRN

## 2021-04-29 SURGICAL SUPPLY — 55 items
ADH SKN CLS APL DERMABOND .7 (GAUZE/BANDAGES/DRESSINGS) ×1
BAG COUNTER SPONGE SURGICOUNT (BAG) ×2 IMPLANT
BAG SPNG CNTER NS LX DISP (BAG) ×1
BAG SURGICOUNT SPONGE COUNTING (BAG) ×1
BAND INSRT 18 STRL LF DISP RB (MISCELLANEOUS) ×2
BAND RUBBER #18 3X1/16 STRL (MISCELLANEOUS) ×6 IMPLANT
BUR DRUM 4.0 (BURR) ×2 IMPLANT
BUR DRUM 4.0MM (BURR) ×1
BUR MATCHSTICK NEURO 3.0 LAGG (BURR) ×3 IMPLANT
CANISTER SUCT 3000ML PPV (MISCELLANEOUS) ×3 IMPLANT
CAP END 15X13 MONOLITH PARA (Neuro Prosthesis/Implant) IMPLANT
CARTRIDGE OIL MAESTRO DRILL (MISCELLANEOUS) ×1 IMPLANT
CORE MONOLITH 012X11 (Cage) ×2 IMPLANT
DECANTER SPIKE VIAL GLASS SM (MISCELLANEOUS) ×3 IMPLANT
DERMABOND ADVANCED (GAUZE/BANDAGES/DRESSINGS) ×2
DERMABOND ADVANCED .7 DNX12 (GAUZE/BANDAGES/DRESSINGS) ×1 IMPLANT
DIFFUSER DRILL AIR PNEUMATIC (MISCELLANEOUS) ×3 IMPLANT
DRAPE HALF SHEET 40X57 (DRAPES) IMPLANT
DRAPE LAPAROTOMY 100X72 PEDS (DRAPES) ×3 IMPLANT
DRAPE MICROSCOPE LEICA (MISCELLANEOUS) ×3 IMPLANT
DURAPREP 6ML APPLICATOR 50/CS (WOUND CARE) ×3 IMPLANT
ELECT COATED BLADE 2.86 ST (ELECTRODE) ×3 IMPLANT
ELECT REM PT RETURN 9FT ADLT (ELECTROSURGICAL) ×3
ELECTRODE REM PT RTRN 9FT ADLT (ELECTROSURGICAL) ×1 IMPLANT
ENDCAP MONOLITH PARA 15X13MM (Neuro Prosthesis/Implant) ×4 IMPLANT
GAUZE 4X4 16PLY ~~LOC~~+RFID DBL (SPONGE) IMPLANT
GLOVE EXAM NITRILE XL STR (GLOVE) IMPLANT
GLOVE SURG LTX SZ6.5 (GLOVE) ×3 IMPLANT
GOWN STRL REUS W/ TWL LRG LVL3 (GOWN DISPOSABLE) ×3 IMPLANT
GOWN STRL REUS W/ TWL XL LVL3 (GOWN DISPOSABLE) IMPLANT
GOWN STRL REUS W/TWL 2XL LVL3 (GOWN DISPOSABLE) IMPLANT
GOWN STRL REUS W/TWL LRG LVL3 (GOWN DISPOSABLE) ×9
GOWN STRL REUS W/TWL XL LVL3 (GOWN DISPOSABLE)
KIT BASIN OR (CUSTOM PROCEDURE TRAY) ×3 IMPLANT
KIT TURNOVER KIT B (KITS) ×3 IMPLANT
NDL HYPO 25X1 1.5 SAFETY (NEEDLE) ×1 IMPLANT
NDL SPNL 22GX3.5 QUINCKE BK (NEEDLE) ×1 IMPLANT
NEEDLE HYPO 25X1 1.5 SAFETY (NEEDLE) ×3 IMPLANT
NEEDLE SPNL 22GX3.5 QUINCKE BK (NEEDLE) ×3 IMPLANT
NS IRRIG 1000ML POUR BTL (IV SOLUTION) ×3 IMPLANT
OIL CARTRIDGE MAESTRO DRILL (MISCELLANEOUS) ×3
PACK LAMINECTOMY NEURO (CUSTOM PROCEDURE TRAY) ×3 IMPLANT
PAD ARMBOARD 7.5X6 YLW CONV (MISCELLANEOUS) ×9 IMPLANT
PLATE ACP 1.6V PLATE 34 2L (Plate) ×2 IMPLANT
SCREW ACP 3.5 X 13 S/D VARIA (Screw) ×3 IMPLANT
SCREW ACP 3.5X13 S/D VAR ANGLE (Screw) IMPLANT
SCREW ACP ST VARI 3.5X13 (Screw) ×6 IMPLANT
SPONGE INTESTINAL PEANUT (DISPOSABLE) ×3 IMPLANT
SPONGE SURGIFOAM ABS GEL SZ50 (HEMOSTASIS) ×3 IMPLANT
SUT VIC AB 0 CT1 27 (SUTURE)
SUT VIC AB 0 CT1 27XBRD ANTBC (SUTURE) IMPLANT
SUT VIC AB 3-0 SH 8-18 (SUTURE) ×3 IMPLANT
TOWEL GREEN STERILE (TOWEL DISPOSABLE) ×3 IMPLANT
TOWEL GREEN STERILE FF (TOWEL DISPOSABLE) ×3 IMPLANT
WATER STERILE IRR 1000ML POUR (IV SOLUTION) ×3 IMPLANT

## 2021-04-29 NOTE — Transfer of Care (Signed)
Immediate Anesthesia Transfer of Care Note  Patient: Rita Whitaker  Procedure(s) Performed: CERVICAL SIX CORPECTOMY (Spine Cervical)  Patient Location: PACU  Anesthesia Type:General  Level of Consciousness: drowsy and patient cooperative  Airway & Oxygen Therapy: Patient Spontanous Breathing and Patient connected to nasal cannula oxygen  Post-op Assessment: Report given to RN and Post -op Vital signs reviewed and stable  Post vital signs: Reviewed and stable  Last Vitals:  Vitals Value Taken Time  BP    Temp    Pulse 80 04/29/21 1750  Resp    SpO2 100 % 04/29/21 1750  Vitals shown include unvalidated device data.  Last Pain:  Vitals:   04/29/21 1104  PainSc: 6          Complications: No notable events documented.

## 2021-04-29 NOTE — Op Note (Signed)
04/29/2021  6:49 PM  PATIENT:  Rita Whitaker  75 y.o. female with neck pain and spondylolisthesis At C5 PRE-OPERATIVE DIAGNOSIS:  Cervicalgia Spondylolisthesis C5/6,6/7 POST-OPERATIVE DIAGNOSIS:  Spondylolisthesis C5/6,6/7  PROCEDURE: Cervical Corpectomy C6 Arthrodesis C5-7 with 23mm Peek cage packed with local auto graft Anterior instrumentation(Nuvasive) C5-7 Microscopic dissection SURGEON:   Surgeon(s): Ashok Pall, MD   ASSISTANTS:Dawley, Pieter Partridge  ANESTHESIA:   general  EBL:  Total I/O In: 1500 [I.V.:1500] Out: 100 [Blood:100]  BLOOD ADMINISTERED:none  CELL SAVER GIVEN:none  COUNT:per nursing  DRAINS: none   SPECIMEN:  No Specimen  DICTATION: Mrs. Rance was taken to the operating room, intubated, and placed under general anesthesia without difficulty. She was positioned supine with her head in slight extension on a horseshoe headrest. The neck was prepped and draped in a sterile manner. I infiltrated 5 cc's 1/2%lidocaine/1:200,000 strength epinephrine into the planned incision starting from the midline to the medial border of the left sternocleidomastoid muscle. I opened the incision with a 10 blade and dissected sharply through soft tissue to the platysma. I dissected in the plane superior to the platysma both rostrally and caudally. I then opened the platysma in a horizontal fashion with Metzenbaum scissors, and dissected in the inferior plane rostrally and caudally. With both blunt and sharp technique I created an avascular corridor to the cervical spine. I placed a spinal needle(s) in the disc space at 6/7 . I then reflected the longus colli from C5 to C7 and placed self retaining retractors. I opened the disc space(s) at 5,/6,6/7 with a 15 blade. I removed disc with curettes, Kerrison punches, and the drill. Using the drill I removed osteophytes and prepared for the decompression.  I decompressed the spinal canal and the C6, and 7  root(s)  via a corpectomy of C6  with the drill, Kerrison punches, and the curettes. I used the microscope to aid in microdissection. I removed the posterior longitudinal ligament to fully expose and decompress the thecal sac. I exposed the roots laterally taking down the 5/6, and 6/7 uncovertebral joints. With the decompression complete I moved on to the arthrodesis. I used the drill to level the surfaces of C5, and 7. I removed soft tissue to prepare the disc space and the bony surfaces. I measured the space and placed a 25mm Peek cage filled with autograft morsels into the corpectomy space. We then placed the anterior instrumentation. I placed 2 screws in each vertebral body through the plate. I locked the screws into place. Intraoperative xray showed the graft, plate, and screws to be in good position. I irrigated the wound, achieved hemostasis, and closed the wound in layers. I approximated the platysma, and the subcuticular plane with vicryl sutures. I used Dermabond for a sterile dressing.   PLAN OF CARE: Admit for overnight observation  PATIENT DISPOSITION:  PACU - hemodynamically stable.   Delay start of Pharmacological VTE agent (>24hrs) due to surgical blood loss or risk of bleeding:  yes

## 2021-04-29 NOTE — Anesthesia Postprocedure Evaluation (Signed)
Anesthesia Post Note  Patient: Rita Whitaker  Procedure(s) Performed: CERVICAL SIX CORPECTOMY (Spine Cervical)     Patient location during evaluation: PACU Anesthesia Type: General Level of consciousness: awake and alert Pain management: pain level controlled Vital Signs Assessment: post-procedure vital signs reviewed and stable Respiratory status: spontaneous breathing, nonlabored ventilation, respiratory function stable and patient connected to nasal cannula oxygen Cardiovascular status: blood pressure returned to baseline and stable Postop Assessment: no apparent nausea or vomiting Anesthetic complications: no   No notable events documented.  Last Vitals:  Vitals:   04/29/21 2045 04/29/21 2130  BP:  (!) 155/64  Pulse:    Resp: 16 16  Temp:    SpO2:  98%    Last Pain:  Vitals:   04/29/21 2130  PainSc: 0-No pain                 [name]

## 2021-04-29 NOTE — H&P (Signed)
BP (!) 159/67   Pulse 76   Temp 99.2 F (37.3 C)   Resp 18   SpO2 99%   I saw Rita Whitaker in the office for evaluation of neck pain.  She has had this neck pain since at least August 01, 2019, when she fell on her head.  She had a laceration there, and while the pain did not start then, she says it started a few months afterward.  She has had physical therapy for the neck and that has not given her any improvement.  She was seen at [facility], x-rays were done, and then she was sent to me for further evaluation.  She denies any upper extremity pain.     Rita Whitaker states that she is working with her primary care physician for some urinary and bowel incontinence.  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  She is taking Solifenacin, Fenofibrate, Lisinopril, Hydrochlorothiazide, Calcium Citrate, Vitamin D3, Multivitamin, Famotidine, Tylenol.  She has an allergy to latex.  She has an intolerance to Demerol, causes nausea, vomiting, and diarrhea.  Nitrofurantoin causes hives.  Sulfa medications cause a rash.     Review of systems is positive for easy bruising, abdominal pain, neck pain, and urinary/bowel incontinence.     Past medical history otherwise good.  She does have chronic renal disease.  There is a history of cancer, melanoma, and bladder cancer in the family history.     She has undergone x-rays and physical therapy in the past.  She does not have weakness.  She does have trouble controlling her bowel and bladder function and she says this is not new.  She is married.  She does have children.  She is right handed.  She does use alcohol.  She does not use tobacco.     On exam, she is alert, oriented x4.  She answers all questions appropriately.  Memory, language, attention span, and fund of knowledge are  normal.  Pupils equal, round, and react to light.  Full extraocular movements.  Full visual fields.  Hearing intact to voice.  Shoulder shrug is normal.  She has intact proprioception.  She has a negative Romberg.  She is unable to tandem walk, losing her balance frequently.  She has osteoarthritis of the hands.  The distal phalanges have an ulnar deviation.  She has normal strength upper and lower extremities, 2+ reflexes, biceps, triceps, brachioradialis, knees, and ankles, slightly brisk but not 3+.  She does not have clonus.     X-ray of the cervical spine is reviewed.  What it shows is a significant amount of spondylitic change present at 4-5, 5-6, and 6-7.  She has foraminal narrowing present on the lateral film at 5-6 slightly on the right side, significantly more prominent at 5-6 on the right side.     I will have Rita Whitaker undergo an MRI of the cervical spine.  I would like to see the spinal canal and the neural foramina on the MRI.  She, again, is without any radicular signs and is quite forthright in stating that she does not have enough pain at this point that she would consider undergoing any kind of operative treatment, but she is in discomfort and has to live with that discomfort each and every day.  She is unable to take anti-inflammatories  secondary to chronic kidney disease.  She has had physical therapy, she has had time with doctor's management, she is unable to take anti-inflammatories, so she does meet any and all criteria to undergo the MRI.  I will see her in the office afterward.   Rita Whitaker returns for a form of recheck.  The last time I had seen her she had pain in the neck, but it was not significant.  She returns today stating that the pain is quite significant.  There was edema in the vertebral bodies at C1 and C2 on the initial scan that we had at the end of last year.  What I will do is have her try physical therapy to include any and all modalities that they may  believe are helpful.  The biggest problem is at C5-6 and C6-7, but she has absolutely no radicular signs.  She complains about neck pain and pain radiating into the right side of the head and the temporal and parietal regions.  So, I am not sure that this is stemming from the cervical spine, but for right now we will act like it is and then I will see her back in the office.Rita Whitaker returns.  She has had time.  She has had therapy.  She has had no relief.  She still has a great deal of cervical pain.  She has a retrolisthesis of C5 on C6 and of C6 on C7.  I will have her undergo a new MRI, as the last film was performed in November 2021.  We will see if this makes a change in any kind of determination. The film has not changed. She is having persistent and increasing pain in the neck.  Rita Whitaker has decided to undergo an anterior cervical decompression and arthrodesis for spondylosis, spondylolisthesis, cervicalgia at levels C5/6,6/7. Risks and benefits including but not limited to bleeding, infection, paralysis, weakness in one or both extremities, bowel and/or bladder dysfunction, fusion failure, hardware failure, need for further surgery, no relief of pain. She understands and wishes to proceed.

## 2021-04-29 NOTE — Anesthesia Procedure Notes (Signed)
Procedure Name: Intubation Date/Time: 04/29/2021 2:35 PM Performed by: Janace Litten, CRNA Pre-anesthesia Checklist: Patient identified, Emergency Drugs available, Suction available and Patient being monitored Patient Re-evaluated:Patient Re-evaluated prior to induction Oxygen Delivery Method: Circle System Utilized Preoxygenation: Pre-oxygenation with 100% oxygen Induction Type: IV induction Ventilation: Mask ventilation without difficulty Laryngoscope Size: Glidescope and 3 Grade View: Grade I Tube type: Oral Tube size: 7.0 mm Number of attempts: 1 Airway Equipment and Method: Stylet and Oral airway Placement Confirmation: ETT inserted through vocal cords under direct vision, positive ETCO2 and breath sounds checked- equal and bilateral Secured at: 20 cm Tube secured with: Tape Dental Injury: Teeth and Oropharynx as per pre-operative assessment  Comments: 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

## 2021-04-30 ENCOUNTER — Encounter (HOSPITAL_COMMUNITY): Payer: Self-pay | Admitting: Neurosurgery

## 2021-04-30 DIAGNOSIS — Z885 Allergy status to narcotic agent status: Secondary | ICD-10-CM | POA: Diagnosis not present

## 2021-04-30 DIAGNOSIS — Z882 Allergy status to sulfonamides status: Secondary | ICD-10-CM | POA: Diagnosis not present

## 2021-04-30 DIAGNOSIS — M6281 Muscle weakness (generalized): Secondary | ICD-10-CM | POA: Diagnosis not present

## 2021-04-30 DIAGNOSIS — Z91040 Latex allergy status: Secondary | ICD-10-CM | POA: Diagnosis not present

## 2021-04-30 DIAGNOSIS — Z881 Allergy status to other antibiotic agents status: Secondary | ICD-10-CM | POA: Diagnosis not present

## 2021-04-30 DIAGNOSIS — M4312 Spondylolisthesis, cervical region: Secondary | ICD-10-CM | POA: Diagnosis not present

## 2021-04-30 MED ORDER — TIZANIDINE HCL 4 MG PO TABS: 4.0000 mg | ORAL_TABLET | Freq: Four times a day (QID) | ORAL | 0 refills | Status: DC | PRN

## 2021-04-30 MED ORDER — HEMOSTATIC AGENTS (NO CHARGE) OPTIME
TOPICAL | Status: DC | PRN
  Administered 2021-04-29: 1 via TOPICAL

## 2021-04-30 MED ORDER — THROMBIN 5000 UNITS EX SOLR
CUTANEOUS | Status: DC | PRN
  Administered 2021-04-29 (×2): 5000 [IU] via TOPICAL

## 2021-04-30 NOTE — Plan of Care (Signed)
  Problem: Activity: Goal: Ability to perform activities at highest level will improve Outcome: Adequate for Discharge Goal: Muscle strength will improve Outcome: Adequate for Discharge   Problem: Bowel/Gastric: Goal: Ability to demonstrate the techniques of an individualized bowel program will improve Outcome: Adequate for Discharge   Problem: Education: Goal: Knowledge of disease or condition will improve Outcome: Adequate for Discharge Goal: Knowledge of the prescribed therapeutic regimen will improve Outcome: Adequate for Discharge   Problem: Coping: Goal: Ability to identify and develop effective coping behavior will improve Outcome: Adequate for Discharge Goal: Ability to verbalize feelings will improve Outcome: Adequate for Discharge   Problem: Self-Care: Goal: Ability to participate in self-care as condition permits will improve Outcome: Adequate for Discharge   Problem: Skin Integrity: Goal: Risk for impaired skin integrity will decrease Outcome: Adequate for Discharge   Problem: Urinary Elimination: Goal: Ability to achieve a regular elimination pattern will improve Outcome: Adequate for Discharge

## 2021-04-30 NOTE — Evaluation (Signed)
Physical Therapy Evaluation Patient Details Name: Rita Whitaker MRN: [id number] DOB: [birth year] Today's Date: 04/30/2021   History of Present Illness  75 y.o. female with neck pain and spondylolisthesis.  Underwent ACDF C5-C7.  PMH: not listed.  Clinical Impression  PTA, patient lives with husband and reports independence. Patient currently functioning at independent level with no AD. Educated patient on cervical precautions and able to maintain throughout mobility. Educated patient on progressive walking program upon discharge. No further skilled PT needs required acutely. No PT follow up recommended at this time.     Follow Up Recommendations No PT follow up    Equipment Recommendations  None recommended by PT    Recommendations for Other Services       Precautions / Restrictions Precautions Precautions: Cervical Precaution Booklet Issued: Yes (comment) Precaution Comments: No brace Restrictions Weight Bearing Restrictions: No      Mobility  Bed Mobility Overal bed mobility: Modified Independent             General bed mobility comments: up in room on arrival    Transfers Overall transfer level: Independent                  Ambulation/Gait Ambulation/Gait assistance: Independent Gait Distance (Feet): 200 Feet Assistive device: None Gait Pattern/deviations: WFL(Within Functional Limits)        Stairs            Wheelchair Mobility    Modified Rankin (Stroke Patients Only)       Balance Overall balance assessment: No apparent balance deficits (not formally assessed)                                           Pertinent Vitals/Pain Pain Assessment: No/denies pain    Home Living Family/patient expects to be discharged to:: Private residence Living Arrangements: Spouse/significant other Available Help at Discharge: Family;Available 24 hours/day Type of Home: House Home Access: Ramped entrance     Home  Layout: One level Home Equipment: [place] - 2 wheels;Cane - single point;Hand held shower head;Shower seat      Prior Function Level of Independence: Independent               Hand Dominance   Dominant Hand: Right    Extremity/Trunk Assessment   Upper Extremity Assessment Upper Extremity Assessment: Overall WFL for tasks assessed    Lower Extremity Assessment Lower Extremity Assessment: Overall WFL for tasks assessed    Cervical / Trunk Assessment Cervical / Trunk Assessment: Normal;Other exceptions Cervical / Trunk Exceptions: C spine surgery  Communication   Communication: No difficulties  Cognition Arousal/Alertness: Awake/alert Behavior During Therapy: WFL for tasks assessed/performed Overall Cognitive Status: Within Functional Limits for tasks assessed                                        General Comments      Exercises     Assessment/Plan    PT Assessment Patent does not need any further PT services  PT Problem List         PT Treatment Interventions      PT Goals (Current goals can be found in the Care Plan section)  Acute Rehab PT Goals Patient Stated Goal: Return home PT Goal Formulation: All assessment and education complete, DC  therapy    Frequency     Barriers to discharge        Co-evaluation               AM-PAC PT "6 Clicks" Mobility  Outcome Measure Help needed turning from your back to your side while in a flat bed without using bedrails?: None Help needed moving from lying on your back to sitting on the side of a flat bed without using bedrails?: None Help needed moving to and from a bed to a chair (including a wheelchair)?: None Help needed standing up from a chair using your arms (e.g., wheelchair or bedside chair)?: None Help needed to walk in hospital room?: None Help needed climbing 3-5 steps with a railing? : None 6 Click Score: 24    End of Session   Activity Tolerance: Patient tolerated  treatment well Patient left: in bed;with call bell/phone within reach Nurse Communication: Mobility status PT Visit Diagnosis: Muscle weakness (generalized) (M62.81)    Time: 9163-8466 PT Time Calculation (min) (ACUTE ONLY): 10 min   Charges:   PT Evaluation $PT Eval Low Complexity: 1 Low          Tristyn Pharris A. Gilford Rile PT, DPT Acute Rehabilitation Services Pager [phone] Office [phone]   Linna Hoff 04/30/2021, 9:57 AM

## 2021-04-30 NOTE — Plan of Care (Signed)
  Problem: Activity: Goal: Ability to perform activities at highest level will improve Outcome: Progressing Goal: Muscle strength will improve Outcome: Progressing   Problem: Bowel/Gastric: Goal: Ability to demonstrate the techniques of an individualized bowel program will improve Outcome: Progressing   Problem: Education: Goal: Knowledge of disease or condition will improve Outcome: Progressing Goal: Knowledge of the prescribed therapeutic regimen will improve Outcome: Progressing   Problem: Coping: Goal: Ability to identify and develop effective coping behavior will improve Outcome: Progressing Goal: Ability to verbalize feelings will improve Outcome: Progressing   Problem: Self-Care: Goal: Ability to participate in self-care as condition permits will improve Outcome: Progressing   Problem: Skin Integrity: Goal: Risk for impaired skin integrity will decrease Outcome: Progressing   Problem: Urinary Elimination: Goal: Ability to achieve a regular elimination pattern will improve Outcome: Progressing

## 2021-04-30 NOTE — Discharge Summary (Signed)
Physician Discharge Summary  Patient ID: Rita Whitaker MRN: [id number] DOB/AGE: 02/09/1946 75 y.o.  Admit date: 04/29/2021 Discharge date: 04/30/2021  Admission Diagnoses:Cervical spondylolisthesis, C5/6,6/7  Discharge Diagnoses: same Active Problems:   Spondylolisthesis, cervical region   Discharged Condition: good  Hospital Course: Rita Whitaker was admitted and taken to the operating room for an uncomplicated Cervical six corpectomy. Postop she is ambulating, voiding, and tolerating a regular diet. She has full strength in all extremities  Treatments: surgery: Cervical 6 corpectomy nuvasive cage with local autograft morsels, cervical plate nuvasive Q1-9 for arthrodesis  Discharge Exam: Blood pressure (!) 130/51, pulse 69, temperature 97.7 F (36.5 C), temperature source Oral, resp. rate 17, SpO2 100 %. General appearance: alert, cooperative, appears stated age, and no distress Neurologic: Alert and oriented X 3, normal strength and tone. Normal symmetric reflexes. Normal coordination and gait  Disposition: Discharge disposition: 01-Home or Self Care      Cervicalgia  Allergies as of 04/30/2021       Reactions   Demerol [meperidine] Other (See Comments)   Upset stomach   Latex    Gets a rash   Nitrofurantoin Hives   Sulfa Antibiotics Rash        Medication List     TAKE these medications    acetaminophen 650 MG CR tablet Commonly known as: TYLENOL Take 650 mg every 8 (eight) hours as needed by mouth for pain.   colestipol 1 g tablet Commonly known as: COLESTID Take 1 g by mouth at bedtime as needed (frequent MB).   famotidine 20 MG tablet Commonly known as: PEPCID Take 20 mg by mouth daily.   fenofibrate micronized 67 MG capsule Commonly known as: LOFIBRA Take 67 mg by mouth daily with supper.   ferrous gluconate 240 (27 FE) MG tablet Commonly known as: FERGON Take 240 mg by mouth 2 (two) times daily.   lisinopril-hydrochlorothiazide 20-25  MG tablet Commonly known as: ZESTORETIC Take 1 tablet by mouth at bedtime.   MULTIVITAMIN ADULTS 50+ PO Take 1 tablet daily by mouth.   sodium chloride 0.65 % Soln nasal spray Commonly known as: OCEAN Place 1 spray into both nostrils as needed for congestion.   solifenacin 5 MG tablet Commonly known as: VESICARE Take 5 mg daily by mouth.   tiZANidine 4 MG tablet Commonly known as: ZANAFLEX Take 1 tablet (4 mg total) by mouth every 6 (six) hours as needed for muscle spasms.        Follow-up Information     Ashok Pall, MD Follow up.   Specialty: Neurosurgery Contact information: [address] [place] [address] [place] [address] [phone]                 Signed: Ashok Pall 04/30/2021, 2:18 PM

## 2021-04-30 NOTE — Evaluation (Signed)
Occupational Therapy Evaluation Patient Details Name: Rita Whitaker MRN: [id number] DOB: September 28, 1946 Today's Date: 04/30/2021    History of Present Illness 75 y.o. female with neck pain and spondylolisthesis.  Underwent ACDF C5-C7.  PMH: not listed.   Clinical Impression   Patient admitted for the diagnosis and procedure above.  She lives at home with her spouse, who can assist as needed.  Currently she is at her baseline, and no further acute OT needs are identified.  All questions answered and precautions reviewed.      Follow Up Recommendations  No OT follow up    Equipment Recommendations  None recommended by OT    Recommendations for Other Services       Precautions / Restrictions Precautions Precautions: Cervical Precaution Booklet Issued: Yes (comment) Precaution Comments: No brace Restrictions Weight Bearing Restrictions: No      Mobility Bed Mobility Overal bed mobility: Modified Independent               Patient Response: Cooperative  Transfers Overall transfer level: Independent                    Balance Overall balance assessment: No apparent balance deficits (not formally assessed)                                         ADL either performed or assessed with clinical judgement   ADL Overall ADL's : At baseline                                             Vision Baseline Vision/History: Wears glasses Wears Glasses: At all times Patient Visual Report: No change from baseline       Perception     Praxis      Pertinent Vitals/Pain Pain Assessment: No/denies pain     Hand Dominance Right   Extremity/Trunk Assessment Upper Extremity Assessment Upper Extremity Assessment: Overall WFL for tasks assessed   Lower Extremity Assessment Lower Extremity Assessment: Defer to PT evaluation   Cervical / Trunk Assessment Cervical / Trunk Assessment: Normal;Other exceptions Cervical / Trunk  Exceptions: C spine surgery   Communication Communication Communication: No difficulties   Cognition Arousal/Alertness: Awake/alert Behavior During Therapy: WFL for tasks assessed/performed Overall Cognitive Status: Within Functional Limits for tasks assessed                                                      Home Living Family/patient expects to be discharged to:: Private residence Living Arrangements: Spouse/significant other Available Help at Discharge: Family;Available 24 hours/day Type of Home: House Home Access: Ramped entrance     Home Layout: One level     Bathroom Shower/Tub: Occupational psychologist: Standard Bathroom Accessibility: Yes How Accessible: Accessible via walker Home Equipment: [place] - 2 wheels;Cane - single point;Hand held shower head;Shower seat          Prior Functioning/Environment Level of Independence: Independent                 OT Problem List: Impaired balance (sitting and/or standing)  OT Treatment/Interventions:      OT Goals(Current goals can be found in the care plan section) Acute Rehab OT Goals Patient Stated Goal: Return home OT Goal Formulation: With patient Time For Goal Achievement: 04/30/21 Potential to Achieve Goals: Good  OT Frequency:     Barriers to D/C:  None noted          Co-evaluation              AM-PAC OT "6 Clicks" Daily Activity     Outcome Measure Help from another person eating meals?: None Help from another person taking care of personal grooming?: None Help from another person toileting, which includes using toliet, bedpan, or urinal?: None Help from another person bathing (including washing, rinsing, drying)?: None Help from another person to put on and taking off regular upper body clothing?: None Help from another person to put on and taking off regular lower body clothing?: None 6 Click Score: 24   End of Session Nurse Communication: Mobility  status  Activity Tolerance: Patient tolerated treatment well Patient left: in chair;with call bell/phone within reach  OT Visit Diagnosis: Unsteadiness on feet (R26.81)                Time: 4765-4650 OT Time Calculation (min): 29 min Charges:  OT General Charges $OT Visit: 1 Visit OT Evaluation $OT Eval Moderate Complexity: 1 Mod OT Treatments $Self Care/Home Management : 8-22 mins  04/30/2021  Rita Whitaker, OTR/L  Acute Rehabilitation Services  Office:  [phone]   Rita Whitaker 04/30/2021, 9:42 AM

## 2021-04-30 NOTE — TOC Transition Note (Signed)
Transition of Care [facility]) - CM/SW Discharge Note   Patient Details  Name: Rita Whitaker MRN: [id number] Date of Birth: 07/27/1946  Transition of Care [facility]) CM/SW Contact:  Pollie Friar, RN Phone Number: 04/30/2021, 10:09 AM   Clinical Narrative:    Patient discharging home with self care. No f/u per PT/OT and no DME needs.  Pt has transportation home.   Final next level of care: Home/Self Care Barriers to Discharge: No Barriers Identified   Patient Goals and CMS Choice        Discharge Placement                       Discharge Plan and Services                                     Social Determinants of Health (SDOH) Interventions     Readmission Risk Interventions No flowsheet data found.

## 2021-04-30 NOTE — Discharge Instructions (Signed)

## 2021-05-18 DIAGNOSIS — M4722 Other spondylosis with radiculopathy, cervical region: Secondary | ICD-10-CM | POA: Diagnosis not present

## 2021-05-18 DIAGNOSIS — I10 Essential (primary) hypertension: Secondary | ICD-10-CM | POA: Diagnosis not present

## 2021-06-04 ENCOUNTER — Ambulatory Visit (INDEPENDENT_AMBULATORY_CARE_PROVIDER_SITE_OTHER): Payer: Medicare Other | Admitting: Podiatry

## 2021-06-04 ENCOUNTER — Encounter: Payer: Self-pay | Admitting: Podiatry

## 2021-06-04 ENCOUNTER — Other Ambulatory Visit: Payer: Self-pay

## 2021-06-04 ENCOUNTER — Ambulatory Visit (INDEPENDENT_AMBULATORY_CARE_PROVIDER_SITE_OTHER): Payer: Medicare Other

## 2021-06-04 DIAGNOSIS — M722 Plantar fascial fibromatosis: Secondary | ICD-10-CM | POA: Diagnosis not present

## 2021-06-04 DIAGNOSIS — M79672 Pain in left foot: Secondary | ICD-10-CM

## 2021-06-04 DIAGNOSIS — M79675 Pain in left toe(s): Secondary | ICD-10-CM | POA: Diagnosis not present

## 2021-06-04 DIAGNOSIS — M79674 Pain in right toe(s): Secondary | ICD-10-CM

## 2021-06-04 DIAGNOSIS — B351 Tinea unguium: Secondary | ICD-10-CM | POA: Diagnosis not present

## 2021-06-04 DIAGNOSIS — M76822 Posterior tibial tendinitis, left leg: Secondary | ICD-10-CM | POA: Diagnosis not present

## 2021-06-04 NOTE — Progress Notes (Signed)
Subjective:   Patient ID: Rita Whitaker, female   DOB: 75 y.o.   MRN: [id number]   HPI Patient presents with several different problems with 1 being pain which is started in the middle of the left arch and #2 swelling and inflammation of the left medial ankle that sore when pressed.  Patient has moderate pitting edema around the ankle and also has thick yellow brittle nailbeds 1-5 both feet that she cannot cut   ROS      Objective:  Physical Exam  Neurovascular status unchanged with exquisite discomfort in the distal fascial band left with inflammation pain around the medial ankle consistent with the posterior tibial tendon with no indication of tendon damage or other pathology.  Patient does have swelling around this area and also has thick yellow brittle nailbeds 1-5 both feet that get sore in the corners     Assessment:  Mycotic nail infection 1-5 both feet along with distal fasciitis left and posterior tibial tendinitis left     Plan:  H&P discussed all problems separately.  For the posterior tibial tendinitis we will use support shoes and I did dispense ankle compression stocking to compress the arch and the ankle to try to prevent the swelling.  May require other treatment or MRI and today I did sterile prep injected the distal fascial band left 3 mg Kenalog 5 mg Xylocaine and debrided nailbeds 1-5 both feet no iatrogenic bleeding which would be really repeated as needed  X-rays indicate no signs of arthritis or stress fracture associated with this acute painful condition

## 2021-06-09 ENCOUNTER — Other Ambulatory Visit: Payer: Self-pay | Admitting: Podiatry

## 2021-06-09 DIAGNOSIS — M722 Plantar fascial fibromatosis: Secondary | ICD-10-CM

## 2021-06-25 DIAGNOSIS — I10 Essential (primary) hypertension: Secondary | ICD-10-CM | POA: Diagnosis not present

## 2021-06-25 DIAGNOSIS — E875 Hyperkalemia: Secondary | ICD-10-CM | POA: Diagnosis not present

## 2021-06-25 DIAGNOSIS — D631 Anemia in chronic kidney disease: Secondary | ICD-10-CM | POA: Diagnosis not present

## 2021-06-25 DIAGNOSIS — N184 Chronic kidney disease, stage 4 (severe): Secondary | ICD-10-CM | POA: Diagnosis not present

## 2021-07-02 DIAGNOSIS — N184 Chronic kidney disease, stage 4 (severe): Secondary | ICD-10-CM | POA: Diagnosis not present

## 2021-07-02 DIAGNOSIS — I509 Heart failure, unspecified: Secondary | ICD-10-CM | POA: Diagnosis not present

## 2021-07-02 DIAGNOSIS — E875 Hyperkalemia: Secondary | ICD-10-CM | POA: Diagnosis not present

## 2021-07-02 DIAGNOSIS — D631 Anemia in chronic kidney disease: Secondary | ICD-10-CM | POA: Diagnosis not present

## 2021-07-02 DIAGNOSIS — R809 Proteinuria, unspecified: Secondary | ICD-10-CM | POA: Diagnosis not present

## 2021-07-02 DIAGNOSIS — I10 Essential (primary) hypertension: Secondary | ICD-10-CM | POA: Diagnosis not present

## 2021-08-03 DIAGNOSIS — E785 Hyperlipidemia, unspecified: Secondary | ICD-10-CM | POA: Diagnosis not present

## 2021-08-03 DIAGNOSIS — Z23 Encounter for immunization: Secondary | ICD-10-CM | POA: Diagnosis not present

## 2021-08-03 DIAGNOSIS — Z0000 Encounter for general adult medical examination without abnormal findings: Secondary | ICD-10-CM | POA: Diagnosis not present

## 2021-08-03 DIAGNOSIS — Z1331 Encounter for screening for depression: Secondary | ICD-10-CM | POA: Diagnosis not present

## 2021-08-03 DIAGNOSIS — Z9181 History of falling: Secondary | ICD-10-CM | POA: Diagnosis not present

## 2021-09-14 IMAGING — MG DIGITAL SCREENING BILAT W/ TOMO W/ CAD
6 of 10 series · 6 of 30 positions shown · non-contrast
Comparison: Previous exam(s).

CLINICAL DATA: Screening.

EXAM:
DIGITAL SCREENING BILATERAL MAMMOGRAM WITH TOMO AND CAD

[R MLO synth-2D]
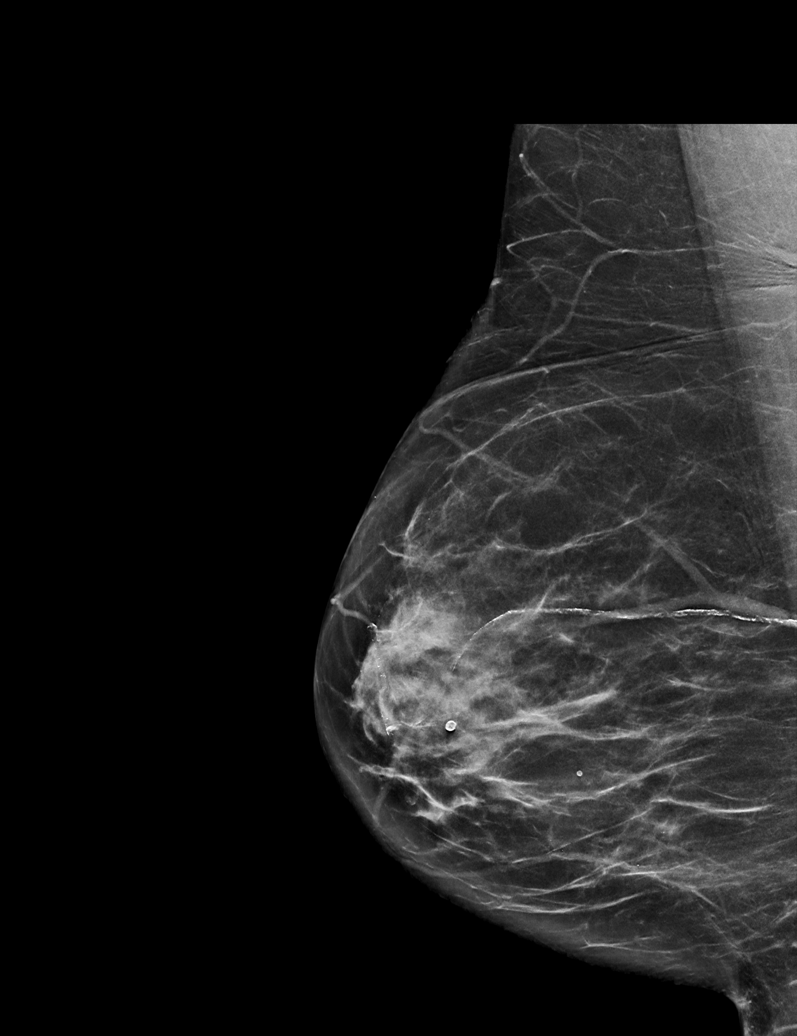

[L MLO synth-2D (1 of 2)]
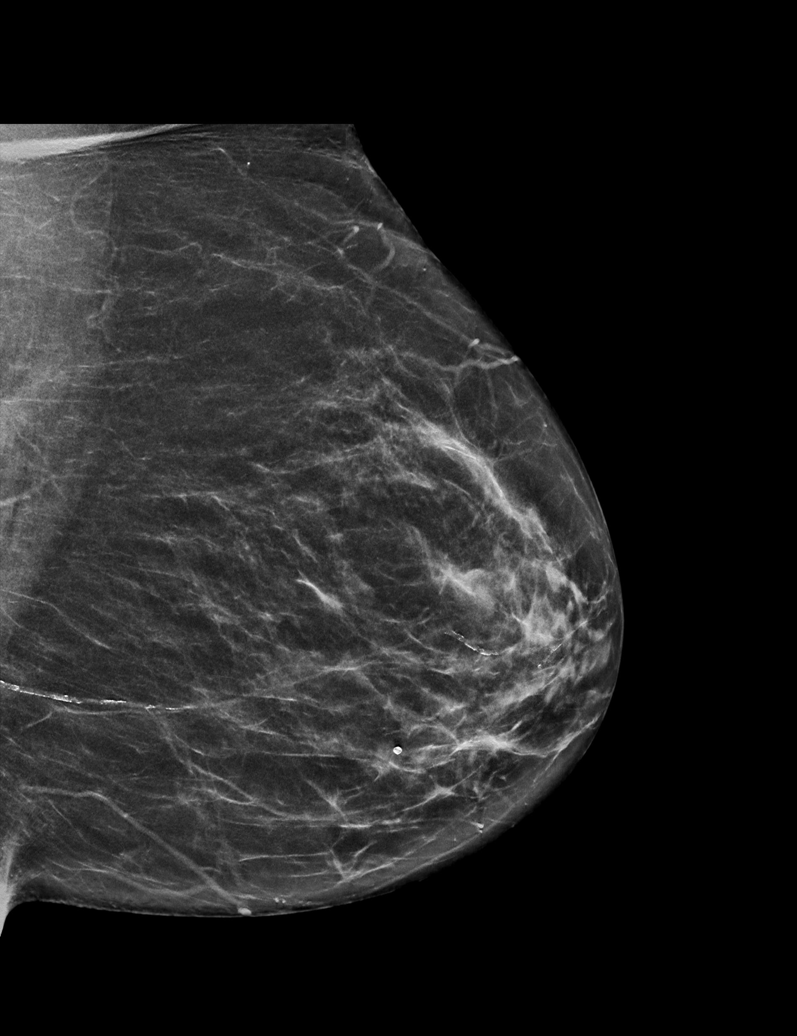

[L MLO synth-2D (2 of 2)]
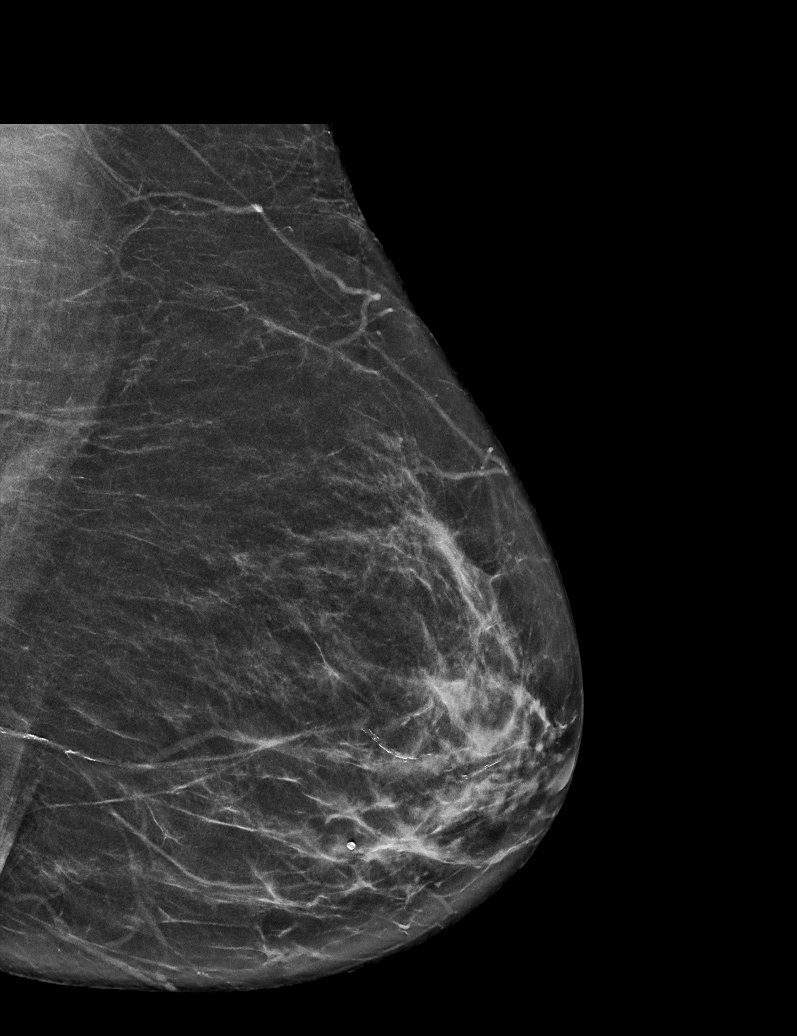

[L CC synth-2D]
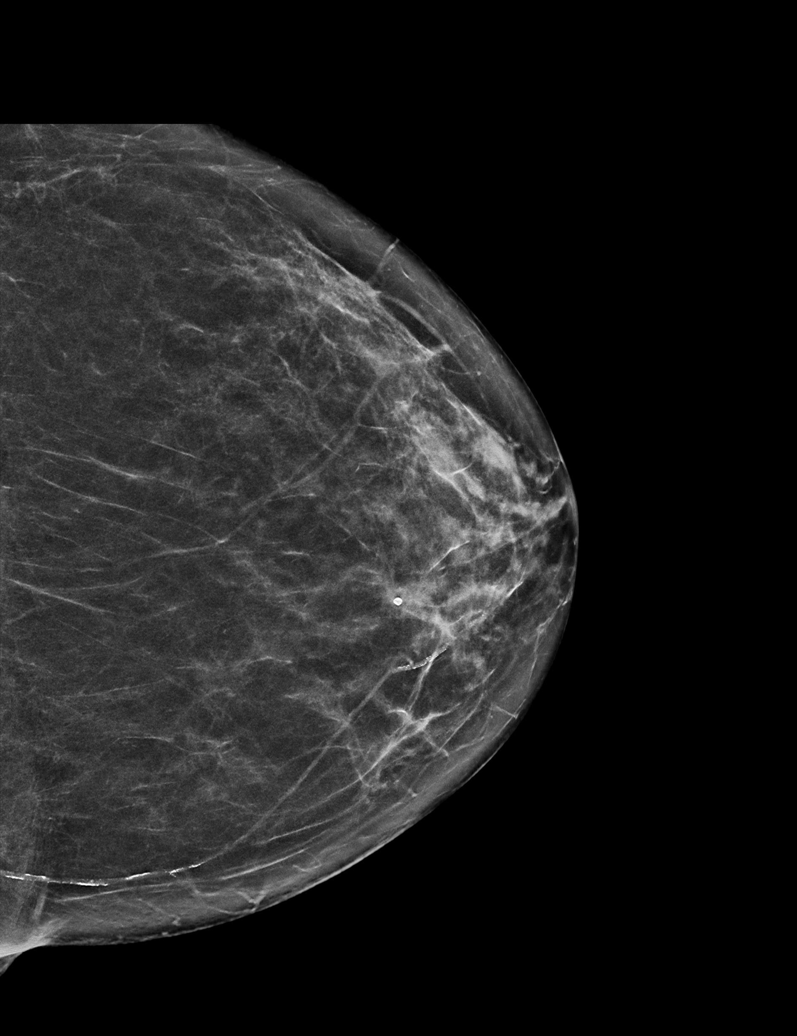

[R CC synth-2D]
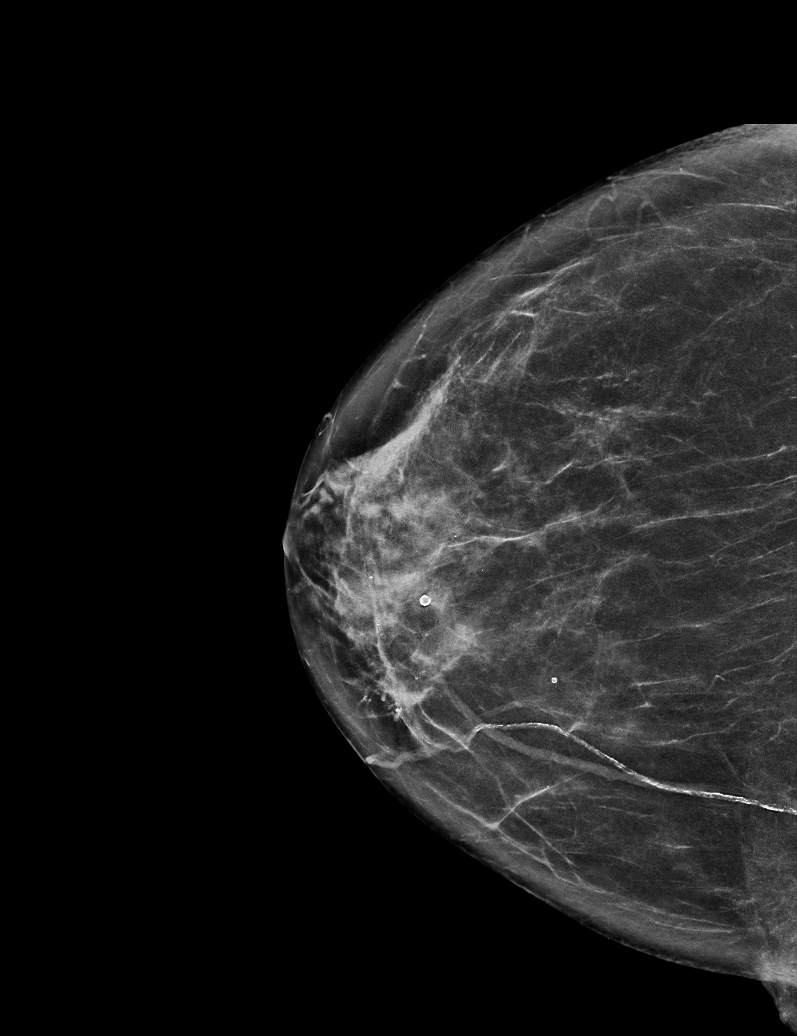

[R MLO tomo · tomo slice 32/63.0]
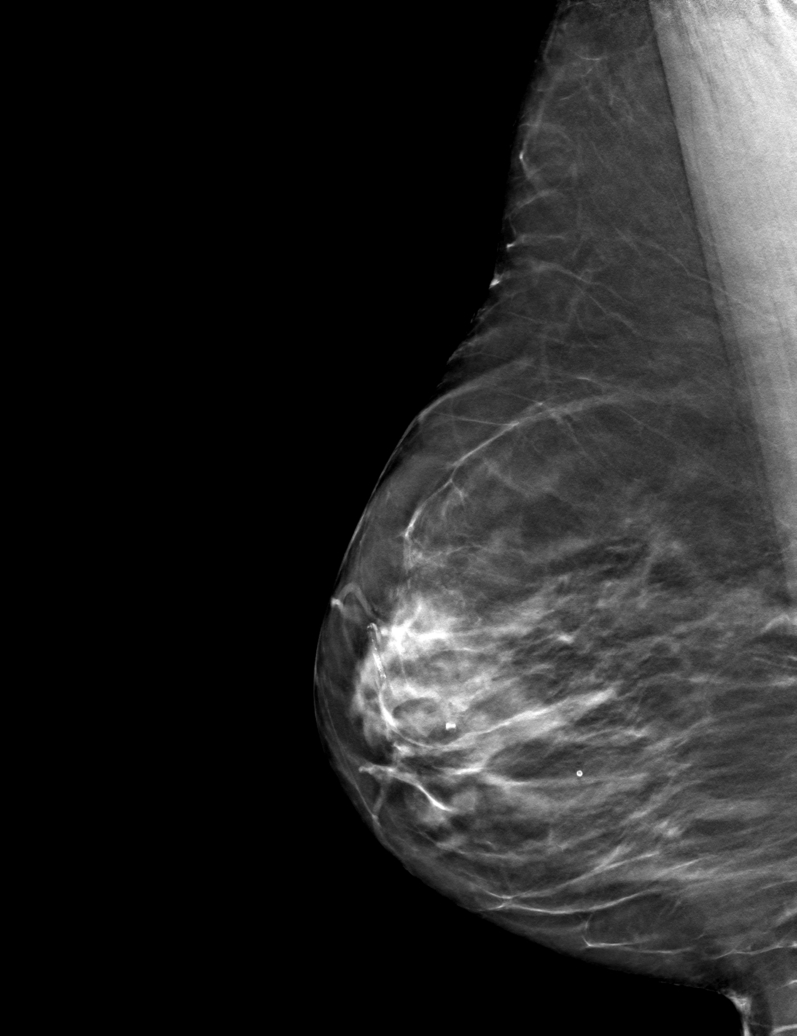

[6 of 30 positions shown; findings below may reference images not displayed]

ACR Breast Density Category c: The breast tissue is heterogeneously
dense, which may obscure small masses.
FINDINGS: There are no findings suspicious for malignancy. Images were
processed with CAD.
IMPRESSION: No mammographic evidence of malignancy. A result letter of this
screening mammogram will be mailed directly to the patient.

RECOMMENDATION:
Screening mammogram in one year. (Code:[id number])

BI-RADS CATEGORY  1: Negative.

## 2021-09-15 DIAGNOSIS — H52223 Regular astigmatism, bilateral: Secondary | ICD-10-CM | POA: Diagnosis not present

## 2021-09-15 DIAGNOSIS — H5203 Hypermetropia, bilateral: Secondary | ICD-10-CM | POA: Diagnosis not present

## 2021-09-15 DIAGNOSIS — Z961 Presence of intraocular lens: Secondary | ICD-10-CM | POA: Diagnosis not present

## 2021-09-15 DIAGNOSIS — H524 Presbyopia: Secondary | ICD-10-CM | POA: Diagnosis not present

## 2021-09-15 DIAGNOSIS — Z135 Encounter for screening for eye and ear disorders: Secondary | ICD-10-CM | POA: Diagnosis not present

## 2021-10-08 DIAGNOSIS — I10 Essential (primary) hypertension: Secondary | ICD-10-CM | POA: Diagnosis not present

## 2021-10-08 DIAGNOSIS — E782 Mixed hyperlipidemia: Secondary | ICD-10-CM | POA: Diagnosis not present

## 2021-10-08 DIAGNOSIS — E7800 Pure hypercholesterolemia, unspecified: Secondary | ICD-10-CM | POA: Diagnosis not present

## 2021-10-08 DIAGNOSIS — Z6822 Body mass index (BMI) 22.0-22.9, adult: Secondary | ICD-10-CM | POA: Diagnosis not present

## 2021-10-08 DIAGNOSIS — N184 Chronic kidney disease, stage 4 (severe): Secondary | ICD-10-CM | POA: Diagnosis not present

## 2021-11-19 DIAGNOSIS — N184 Chronic kidney disease, stage 4 (severe): Secondary | ICD-10-CM | POA: Diagnosis not present

## 2021-11-19 DIAGNOSIS — R809 Proteinuria, unspecified: Secondary | ICD-10-CM | POA: Diagnosis not present

## 2021-11-19 DIAGNOSIS — I10 Essential (primary) hypertension: Secondary | ICD-10-CM | POA: Diagnosis not present

## 2021-11-19 DIAGNOSIS — D631 Anemia in chronic kidney disease: Secondary | ICD-10-CM | POA: Diagnosis not present

## 2021-11-26 DIAGNOSIS — R809 Proteinuria, unspecified: Secondary | ICD-10-CM | POA: Diagnosis not present

## 2021-11-26 DIAGNOSIS — I10 Essential (primary) hypertension: Secondary | ICD-10-CM | POA: Diagnosis not present

## 2021-11-26 DIAGNOSIS — I509 Heart failure, unspecified: Secondary | ICD-10-CM | POA: Diagnosis not present

## 2021-11-26 DIAGNOSIS — D631 Anemia in chronic kidney disease: Secondary | ICD-10-CM | POA: Diagnosis not present

## 2021-11-26 DIAGNOSIS — E875 Hyperkalemia: Secondary | ICD-10-CM | POA: Diagnosis not present

## 2021-11-26 DIAGNOSIS — K529 Noninfective gastroenteritis and colitis, unspecified: Secondary | ICD-10-CM | POA: Diagnosis not present

## 2021-11-26 DIAGNOSIS — N184 Chronic kidney disease, stage 4 (severe): Secondary | ICD-10-CM | POA: Diagnosis not present

## 2021-12-17 ENCOUNTER — Other Ambulatory Visit: Payer: Self-pay | Admitting: Nephrology

## 2021-12-17 DIAGNOSIS — K529 Noninfective gastroenteritis and colitis, unspecified: Secondary | ICD-10-CM

## 2021-12-17 DIAGNOSIS — D631 Anemia in chronic kidney disease: Secondary | ICD-10-CM

## 2021-12-17 DIAGNOSIS — I10 Essential (primary) hypertension: Secondary | ICD-10-CM

## 2021-12-17 DIAGNOSIS — E875 Hyperkalemia: Secondary | ICD-10-CM

## 2021-12-17 DIAGNOSIS — N184 Chronic kidney disease, stage 4 (severe): Secondary | ICD-10-CM

## 2021-12-17 DIAGNOSIS — R809 Proteinuria, unspecified: Secondary | ICD-10-CM

## 2021-12-17 DIAGNOSIS — I509 Heart failure, unspecified: Secondary | ICD-10-CM

## 2021-12-25 ENCOUNTER — Other Ambulatory Visit: Payer: Self-pay

## 2021-12-25 ENCOUNTER — Ambulatory Visit
Admission: RE | Admit: 2021-12-25 | Discharge: 2021-12-25 | Disposition: A | Discharge status: Home / Self Care | Payer: Medicare Other | Source: Ambulatory Visit | Attending: Nephrology | Admitting: Nephrology

## 2021-12-25 DIAGNOSIS — E875 Hyperkalemia: Secondary | ICD-10-CM | POA: Diagnosis not present

## 2021-12-25 DIAGNOSIS — K529 Noninfective gastroenteritis and colitis, unspecified: Secondary | ICD-10-CM | POA: Diagnosis not present

## 2021-12-25 DIAGNOSIS — R809 Proteinuria, unspecified: Secondary | ICD-10-CM | POA: Diagnosis not present

## 2021-12-25 DIAGNOSIS — N184 Chronic kidney disease, stage 4 (severe): Secondary | ICD-10-CM

## 2021-12-25 DIAGNOSIS — D631 Anemia in chronic kidney disease: Secondary | ICD-10-CM

## 2021-12-25 DIAGNOSIS — I10 Essential (primary) hypertension: Secondary | ICD-10-CM

## 2021-12-25 DIAGNOSIS — I509 Heart failure, unspecified: Secondary | ICD-10-CM

## 2021-12-25 DIAGNOSIS — N281 Cyst of kidney, acquired: Secondary | ICD-10-CM | POA: Diagnosis not present

## 2022-01-28 ENCOUNTER — Ambulatory Visit (INDEPENDENT_AMBULATORY_CARE_PROVIDER_SITE_OTHER): Payer: Medicare Other

## 2022-01-28 ENCOUNTER — Ambulatory Visit (INDEPENDENT_AMBULATORY_CARE_PROVIDER_SITE_OTHER): Payer: Medicare Other | Admitting: Podiatry

## 2022-01-28 DIAGNOSIS — M2042 Other hammer toe(s) (acquired), left foot: Secondary | ICD-10-CM | POA: Diagnosis not present

## 2022-01-28 DIAGNOSIS — M7751 Other enthesopathy of right foot: Secondary | ICD-10-CM

## 2022-01-28 DIAGNOSIS — M779 Enthesopathy, unspecified: Secondary | ICD-10-CM

## 2022-01-28 MED ORDER — TRIAMCINOLONE ACETONIDE 10 MG/ML IJ SUSP
10.0000 mg | Freq: Once | INTRAMUSCULAR | Status: AC
  Administered 2022-01-28: 10 mg

## 2022-01-28 NOTE — Progress Notes (Signed)
Subjective:  ? ?Patient ID: Thayer Dallas, female   DOB: 76 y.o.   MRN: [id number]  ? ?HPI ?Patient presents with pain in the right ankle and right lateral foot which is new and also is developing digital deformities left that are painful when pressed and makes shoe gear difficult ? ? ?ROS ? ? ?   ?Objective:  ?Physical Exam  ?Neurovascular status was found to be intact inflammation pain on the dorsal lateral side right foot and into the sinus tarsi and also digital deformities left keratotic lesion formation painful when pressed third and fourth toes ? ?   ?Assessment:  ?Inflammatory capsulitis sinus tarsi right with fluid buildup and hammertoe deformity left third fourth toe ? ?   ?Plan:  ?H&P reviewed both conditions and for the right I went ahead I did sterile prep and injected the sinus tarsi 3 mg Kenalog 5 mg Xylocaine and for the left foot I went ahead and I discussed hammertoe deformity and reviewed the possibility for arthroplasty educating her on recovery.  Debridement accomplished today to try to give her temporary relief ? ?X-rays indicate that there is structural deformity right no indications of advanced pathology ?   ? ? ?

## 2022-01-29 DIAGNOSIS — I10 Essential (primary) hypertension: Secondary | ICD-10-CM | POA: Diagnosis not present

## 2022-01-29 DIAGNOSIS — E782 Mixed hyperlipidemia: Secondary | ICD-10-CM | POA: Diagnosis not present

## 2022-01-29 DIAGNOSIS — N184 Chronic kidney disease, stage 4 (severe): Secondary | ICD-10-CM | POA: Diagnosis not present

## 2022-02-17 DIAGNOSIS — R809 Proteinuria, unspecified: Secondary | ICD-10-CM | POA: Diagnosis not present

## 2022-02-17 DIAGNOSIS — I509 Heart failure, unspecified: Secondary | ICD-10-CM | POA: Diagnosis not present

## 2022-02-17 DIAGNOSIS — D631 Anemia in chronic kidney disease: Secondary | ICD-10-CM | POA: Diagnosis not present

## 2022-02-17 DIAGNOSIS — I10 Essential (primary) hypertension: Secondary | ICD-10-CM | POA: Diagnosis not present

## 2022-02-17 DIAGNOSIS — E875 Hyperkalemia: Secondary | ICD-10-CM | POA: Diagnosis not present

## 2022-02-17 DIAGNOSIS — N184 Chronic kidney disease, stage 4 (severe): Secondary | ICD-10-CM | POA: Diagnosis not present

## 2022-02-17 DIAGNOSIS — K529 Noninfective gastroenteritis and colitis, unspecified: Secondary | ICD-10-CM | POA: Diagnosis not present

## 2022-02-24 DIAGNOSIS — K529 Noninfective gastroenteritis and colitis, unspecified: Secondary | ICD-10-CM | POA: Diagnosis not present

## 2022-02-24 DIAGNOSIS — N1832 Chronic kidney disease, stage 3b: Secondary | ICD-10-CM | POA: Diagnosis not present

## 2022-02-24 DIAGNOSIS — E875 Hyperkalemia: Secondary | ICD-10-CM | POA: Diagnosis not present

## 2022-02-24 DIAGNOSIS — D631 Anemia in chronic kidney disease: Secondary | ICD-10-CM | POA: Diagnosis not present

## 2022-02-24 DIAGNOSIS — R809 Proteinuria, unspecified: Secondary | ICD-10-CM | POA: Diagnosis not present

## 2022-02-24 DIAGNOSIS — R32 Unspecified urinary incontinence: Secondary | ICD-10-CM | POA: Diagnosis not present

## 2022-02-24 DIAGNOSIS — N281 Cyst of kidney, acquired: Secondary | ICD-10-CM | POA: Diagnosis not present

## 2022-02-24 DIAGNOSIS — I509 Heart failure, unspecified: Secondary | ICD-10-CM | POA: Diagnosis not present

## 2022-02-24 DIAGNOSIS — I10 Essential (primary) hypertension: Secondary | ICD-10-CM | POA: Diagnosis not present

## 2022-04-08 ENCOUNTER — Other Ambulatory Visit: Payer: Self-pay | Admitting: Family Medicine

## 2022-04-08 DIAGNOSIS — I10 Essential (primary) hypertension: Secondary | ICD-10-CM | POA: Diagnosis not present

## 2022-04-08 DIAGNOSIS — M25562 Pain in left knee: Secondary | ICD-10-CM | POA: Diagnosis not present

## 2022-04-08 DIAGNOSIS — S50812A Abrasion of left forearm, initial encounter: Secondary | ICD-10-CM | POA: Diagnosis not present

## 2022-04-08 DIAGNOSIS — Z1231 Encounter for screening mammogram for malignant neoplasm of breast: Secondary | ICD-10-CM | POA: Diagnosis not present

## 2022-04-08 DIAGNOSIS — Z139 Encounter for screening, unspecified: Secondary | ICD-10-CM | POA: Diagnosis not present

## 2022-04-08 DIAGNOSIS — L089 Local infection of the skin and subcutaneous tissue, unspecified: Secondary | ICD-10-CM | POA: Diagnosis not present

## 2022-04-08 DIAGNOSIS — S80212A Abrasion, left knee, initial encounter: Secondary | ICD-10-CM | POA: Diagnosis not present

## 2022-04-08 DIAGNOSIS — E782 Mixed hyperlipidemia: Secondary | ICD-10-CM | POA: Diagnosis not present

## 2022-04-08 DIAGNOSIS — N184 Chronic kidney disease, stage 4 (severe): Secondary | ICD-10-CM | POA: Diagnosis not present

## 2022-04-08 DIAGNOSIS — E2839 Other primary ovarian failure: Secondary | ICD-10-CM | POA: Diagnosis not present

## 2022-04-16 ENCOUNTER — Other Ambulatory Visit: Payer: Self-pay | Admitting: Family Medicine

## 2022-04-16 DIAGNOSIS — E2839 Other primary ovarian failure: Secondary | ICD-10-CM

## 2022-04-19 DIAGNOSIS — M4722 Other spondylosis with radiculopathy, cervical region: Secondary | ICD-10-CM | POA: Diagnosis not present

## 2022-04-27 DIAGNOSIS — Z79899 Other long term (current) drug therapy: Secondary | ICD-10-CM | POA: Diagnosis not present

## 2022-04-27 DIAGNOSIS — N952 Postmenopausal atrophic vaginitis: Secondary | ICD-10-CM | POA: Diagnosis not present

## 2022-04-27 DIAGNOSIS — N3281 Overactive bladder: Secondary | ICD-10-CM | POA: Diagnosis not present

## 2022-04-27 DIAGNOSIS — N8111 Cystocele, midline: Secondary | ICD-10-CM | POA: Diagnosis not present

## 2022-06-24 DIAGNOSIS — R809 Proteinuria, unspecified: Secondary | ICD-10-CM | POA: Diagnosis not present

## 2022-06-24 DIAGNOSIS — I509 Heart failure, unspecified: Secondary | ICD-10-CM | POA: Diagnosis not present

## 2022-06-24 DIAGNOSIS — N281 Cyst of kidney, acquired: Secondary | ICD-10-CM | POA: Diagnosis not present

## 2022-06-24 DIAGNOSIS — E875 Hyperkalemia: Secondary | ICD-10-CM | POA: Diagnosis not present

## 2022-06-24 DIAGNOSIS — K529 Noninfective gastroenteritis and colitis, unspecified: Secondary | ICD-10-CM | POA: Diagnosis not present

## 2022-06-24 DIAGNOSIS — R32 Unspecified urinary incontinence: Secondary | ICD-10-CM | POA: Diagnosis not present

## 2022-06-24 DIAGNOSIS — I10 Essential (primary) hypertension: Secondary | ICD-10-CM | POA: Diagnosis not present

## 2022-06-24 DIAGNOSIS — D631 Anemia in chronic kidney disease: Secondary | ICD-10-CM | POA: Diagnosis not present

## 2022-06-24 DIAGNOSIS — N1832 Chronic kidney disease, stage 3b: Secondary | ICD-10-CM | POA: Diagnosis not present

## 2022-07-01 DIAGNOSIS — K529 Noninfective gastroenteritis and colitis, unspecified: Secondary | ICD-10-CM | POA: Diagnosis not present

## 2022-07-01 DIAGNOSIS — R809 Proteinuria, unspecified: Secondary | ICD-10-CM | POA: Diagnosis not present

## 2022-07-01 DIAGNOSIS — R32 Unspecified urinary incontinence: Secondary | ICD-10-CM | POA: Diagnosis not present

## 2022-07-01 DIAGNOSIS — N281 Cyst of kidney, acquired: Secondary | ICD-10-CM | POA: Diagnosis not present

## 2022-07-01 DIAGNOSIS — I10 Essential (primary) hypertension: Secondary | ICD-10-CM | POA: Diagnosis not present

## 2022-07-01 DIAGNOSIS — N1832 Chronic kidney disease, stage 3b: Secondary | ICD-10-CM | POA: Diagnosis not present

## 2022-07-01 DIAGNOSIS — D631 Anemia in chronic kidney disease: Secondary | ICD-10-CM | POA: Diagnosis not present

## 2022-07-01 DIAGNOSIS — N184 Chronic kidney disease, stage 4 (severe): Secondary | ICD-10-CM | POA: Diagnosis not present

## 2022-07-01 DIAGNOSIS — E875 Hyperkalemia: Secondary | ICD-10-CM | POA: Diagnosis not present

## 2022-07-08 DIAGNOSIS — N3946 Mixed incontinence: Secondary | ICD-10-CM | POA: Diagnosis not present

## 2022-07-08 DIAGNOSIS — R351 Nocturia: Secondary | ICD-10-CM | POA: Diagnosis not present

## 2022-07-08 DIAGNOSIS — R82998 Other abnormal findings in urine: Secondary | ICD-10-CM | POA: Diagnosis not present

## 2022-07-08 DIAGNOSIS — N952 Postmenopausal atrophic vaginitis: Secondary | ICD-10-CM | POA: Diagnosis not present

## 2022-07-08 DIAGNOSIS — N8111 Cystocele, midline: Secondary | ICD-10-CM | POA: Diagnosis not present

## 2022-07-08 DIAGNOSIS — R338 Other retention of urine: Secondary | ICD-10-CM | POA: Diagnosis not present

## 2022-07-08 DIAGNOSIS — N3281 Overactive bladder: Secondary | ICD-10-CM | POA: Diagnosis not present

## 2022-07-19 DIAGNOSIS — N8111 Cystocele, midline: Secondary | ICD-10-CM | POA: Diagnosis not present

## 2022-07-19 DIAGNOSIS — N3946 Mixed incontinence: Secondary | ICD-10-CM | POA: Diagnosis not present

## 2022-07-19 DIAGNOSIS — R339 Retention of urine, unspecified: Secondary | ICD-10-CM | POA: Diagnosis not present

## 2022-07-19 DIAGNOSIS — N3281 Overactive bladder: Secondary | ICD-10-CM | POA: Diagnosis not present

## 2022-07-31 DIAGNOSIS — I10 Essential (primary) hypertension: Secondary | ICD-10-CM | POA: Diagnosis not present

## 2022-07-31 DIAGNOSIS — N184 Chronic kidney disease, stage 4 (severe): Secondary | ICD-10-CM | POA: Diagnosis not present

## 2022-07-31 DIAGNOSIS — E782 Mixed hyperlipidemia: Secondary | ICD-10-CM | POA: Diagnosis not present

## 2022-08-09 DIAGNOSIS — Z23 Encounter for immunization: Secondary | ICD-10-CM | POA: Diagnosis not present

## 2022-08-13 ENCOUNTER — Ambulatory Visit (INDEPENDENT_AMBULATORY_CARE_PROVIDER_SITE_OTHER): Payer: Medicare Other | Admitting: Podiatry

## 2022-08-13 ENCOUNTER — Encounter: Payer: Self-pay | Admitting: Podiatry

## 2022-08-13 DIAGNOSIS — M2042 Other hammer toe(s) (acquired), left foot: Secondary | ICD-10-CM

## 2022-08-13 DIAGNOSIS — M7751 Other enthesopathy of right foot: Secondary | ICD-10-CM

## 2022-08-13 MED ORDER — TRIAMCINOLONE ACETONIDE 10 MG/ML IJ SUSP
10.0000 mg | Freq: Once | INTRAMUSCULAR | Status: AC
  Administered 2022-08-13: 10 mg

## 2022-08-15 NOTE — Progress Notes (Signed)
Subjective:   Patient ID: Rita Whitaker, female   DOB: 76 y.o.   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

## 2022-08-16 DIAGNOSIS — N393 Stress incontinence (female) (male): Secondary | ICD-10-CM | POA: Diagnosis not present

## 2022-08-16 DIAGNOSIS — R82998 Other abnormal findings in urine: Secondary | ICD-10-CM | POA: Diagnosis not present

## 2022-08-24 DIAGNOSIS — K52831 Collagenous colitis: Secondary | ICD-10-CM | POA: Diagnosis not present

## 2022-09-20 DIAGNOSIS — H04121 Dry eye syndrome of right lacrimal gland: Secondary | ICD-10-CM | POA: Diagnosis not present

## 2022-09-20 DIAGNOSIS — H43393 Other vitreous opacities, bilateral: Secondary | ICD-10-CM | POA: Diagnosis not present

## 2022-09-30 ENCOUNTER — Ambulatory Visit
Admission: RE | Admit: 2022-09-30 | Discharge: 2022-09-30 | Disposition: A | Discharge status: Home / Self Care | Payer: Medicare Other | Source: Ambulatory Visit | Attending: Family Medicine | Admitting: Family Medicine

## 2022-09-30 DIAGNOSIS — E2839 Other primary ovarian failure: Secondary | ICD-10-CM

## 2022-10-13 DIAGNOSIS — I10 Essential (primary) hypertension: Secondary | ICD-10-CM | POA: Diagnosis not present

## 2022-10-13 DIAGNOSIS — N1832 Chronic kidney disease, stage 3b: Secondary | ICD-10-CM | POA: Diagnosis not present

## 2022-10-13 DIAGNOSIS — E782 Mixed hyperlipidemia: Secondary | ICD-10-CM | POA: Diagnosis not present

## 2022-10-13 DIAGNOSIS — Z9181 History of falling: Secondary | ICD-10-CM | POA: Diagnosis not present

## 2022-10-13 DIAGNOSIS — Z1331 Encounter for screening for depression: Secondary | ICD-10-CM | POA: Diagnosis not present

## 2022-10-13 DIAGNOSIS — M810 Age-related osteoporosis without current pathological fracture: Secondary | ICD-10-CM | POA: Diagnosis not present

## 2022-10-13 DIAGNOSIS — Z6823 Body mass index (BMI) 23.0-23.9, adult: Secondary | ICD-10-CM | POA: Diagnosis not present

## 2022-10-28 DIAGNOSIS — N813 Complete uterovaginal prolapse: Secondary | ICD-10-CM | POA: Diagnosis not present

## 2022-10-28 DIAGNOSIS — N3281 Overactive bladder: Secondary | ICD-10-CM | POA: Diagnosis not present

## 2022-10-28 DIAGNOSIS — Z01818 Encounter for other preprocedural examination: Secondary | ICD-10-CM | POA: Diagnosis not present

## 2022-11-09 DIAGNOSIS — Z01810 Encounter for preprocedural cardiovascular examination: Secondary | ICD-10-CM | POA: Diagnosis not present

## 2022-11-09 DIAGNOSIS — N393 Stress incontinence (female) (male): Secondary | ICD-10-CM | POA: Diagnosis not present

## 2022-11-09 DIAGNOSIS — R350 Frequency of micturition: Secondary | ICD-10-CM | POA: Diagnosis not present

## 2022-11-09 DIAGNOSIS — N1832 Chronic kidney disease, stage 3b: Secondary | ICD-10-CM | POA: Diagnosis not present

## 2022-11-09 DIAGNOSIS — I129 Hypertensive chronic kidney disease with stage 1 through stage 4 chronic kidney disease, or unspecified chronic kidney disease: Secondary | ICD-10-CM | POA: Diagnosis not present

## 2022-11-09 DIAGNOSIS — R339 Retention of urine, unspecified: Secondary | ICD-10-CM | POA: Diagnosis not present

## 2022-11-09 DIAGNOSIS — N8111 Cystocele, midline: Secondary | ICD-10-CM | POA: Diagnosis not present

## 2022-11-09 DIAGNOSIS — N813 Complete uterovaginal prolapse: Secondary | ICD-10-CM | POA: Diagnosis not present

## 2022-11-09 DIAGNOSIS — Z79899 Other long term (current) drug therapy: Secondary | ICD-10-CM | POA: Diagnosis not present

## 2022-11-22 DIAGNOSIS — R8290 Unspecified abnormal findings in urine: Secondary | ICD-10-CM | POA: Diagnosis not present

## 2022-11-22 DIAGNOSIS — N819 Female genital prolapse, unspecified: Secondary | ICD-10-CM | POA: Diagnosis not present

## 2022-12-13 DIAGNOSIS — Z974 Presence of external hearing-aid: Secondary | ICD-10-CM | POA: Diagnosis not present

## 2022-12-13 DIAGNOSIS — I959 Hypotension, unspecified: Secondary | ICD-10-CM | POA: Diagnosis not present

## 2022-12-13 DIAGNOSIS — Z79899 Other long term (current) drug therapy: Secondary | ICD-10-CM | POA: Diagnosis not present

## 2022-12-13 DIAGNOSIS — Q619 Cystic kidney disease, unspecified: Secondary | ICD-10-CM | POA: Diagnosis not present

## 2022-12-13 DIAGNOSIS — M79604 Pain in right leg: Secondary | ICD-10-CM | POA: Diagnosis not present

## 2022-12-13 DIAGNOSIS — N10 Acute tubulo-interstitial nephritis: Secondary | ICD-10-CM | POA: Diagnosis present

## 2022-12-13 DIAGNOSIS — N183 Chronic kidney disease, stage 3 unspecified: Secondary | ICD-10-CM | POA: Diagnosis present

## 2022-12-13 DIAGNOSIS — J189 Pneumonia, unspecified organism: Secondary | ICD-10-CM | POA: Diagnosis present

## 2022-12-13 DIAGNOSIS — E785 Hyperlipidemia, unspecified: Secondary | ICD-10-CM | POA: Diagnosis present

## 2022-12-13 DIAGNOSIS — R509 Fever, unspecified: Secondary | ICD-10-CM | POA: Diagnosis not present

## 2022-12-13 DIAGNOSIS — Z20822 Contact with and (suspected) exposure to covid-19: Secondary | ICD-10-CM | POA: Diagnosis present

## 2022-12-13 DIAGNOSIS — R410 Disorientation, unspecified: Secondary | ICD-10-CM | POA: Diagnosis not present

## 2022-12-13 DIAGNOSIS — Z48817 Encounter for surgical aftercare following surgery on the skin and subcutaneous tissue: Secondary | ICD-10-CM | POA: Diagnosis not present

## 2022-12-13 DIAGNOSIS — Z888 Allergy status to other drugs, medicaments and biological substances status: Secondary | ICD-10-CM | POA: Diagnosis not present

## 2022-12-13 DIAGNOSIS — D631 Anemia in chronic kidney disease: Secondary | ICD-10-CM | POA: Diagnosis present

## 2022-12-13 DIAGNOSIS — Z8249 Family history of ischemic heart disease and other diseases of the circulatory system: Secondary | ICD-10-CM | POA: Diagnosis not present

## 2022-12-13 DIAGNOSIS — N151 Renal and perinephric abscess: Secondary | ICD-10-CM | POA: Diagnosis present

## 2022-12-13 DIAGNOSIS — Z90710 Acquired absence of both cervix and uterus: Secondary | ICD-10-CM | POA: Diagnosis not present

## 2022-12-13 DIAGNOSIS — N12 Tubulo-interstitial nephritis, not specified as acute or chronic: Secondary | ICD-10-CM | POA: Diagnosis not present

## 2022-12-13 DIAGNOSIS — N289 Disorder of kidney and ureter, unspecified: Secondary | ICD-10-CM | POA: Diagnosis not present

## 2022-12-13 DIAGNOSIS — N281 Cyst of kidney, acquired: Secondary | ICD-10-CM | POA: Diagnosis not present

## 2022-12-13 DIAGNOSIS — R112 Nausea with vomiting, unspecified: Secondary | ICD-10-CM | POA: Diagnosis not present

## 2022-12-13 DIAGNOSIS — N179 Acute kidney failure, unspecified: Secondary | ICD-10-CM | POA: Diagnosis present

## 2022-12-13 DIAGNOSIS — I2510 Atherosclerotic heart disease of native coronary artery without angina pectoris: Secondary | ICD-10-CM | POA: Diagnosis not present

## 2022-12-13 DIAGNOSIS — R059 Cough, unspecified: Secondary | ICD-10-CM | POA: Diagnosis not present

## 2022-12-13 DIAGNOSIS — E871 Hypo-osmolality and hyponatremia: Secondary | ICD-10-CM | POA: Diagnosis present

## 2022-12-13 DIAGNOSIS — A419 Sepsis, unspecified organism: Secondary | ICD-10-CM | POA: Diagnosis not present

## 2022-12-13 DIAGNOSIS — E861 Hypovolemia: Secondary | ICD-10-CM | POA: Diagnosis present

## 2022-12-13 DIAGNOSIS — K219 Gastro-esophageal reflux disease without esophagitis: Secondary | ICD-10-CM | POA: Diagnosis present

## 2022-12-13 DIAGNOSIS — Z841 Family history of disorders of kidney and ureter: Secondary | ICD-10-CM | POA: Diagnosis not present

## 2022-12-13 DIAGNOSIS — M1990 Unspecified osteoarthritis, unspecified site: Secondary | ICD-10-CM | POA: Diagnosis present

## 2022-12-13 DIAGNOSIS — R300 Dysuria: Secondary | ICD-10-CM | POA: Diagnosis not present

## 2022-12-13 DIAGNOSIS — R0602 Shortness of breath: Secondary | ICD-10-CM | POA: Diagnosis not present

## 2022-12-13 DIAGNOSIS — J90 Pleural effusion, not elsewhere classified: Secondary | ICD-10-CM | POA: Diagnosis not present

## 2022-12-13 DIAGNOSIS — I129 Hypertensive chronic kidney disease with stage 1 through stage 4 chronic kidney disease, or unspecified chronic kidney disease: Secondary | ICD-10-CM | POA: Diagnosis present

## 2022-12-13 DIAGNOSIS — Z882 Allergy status to sulfonamides status: Secondary | ICD-10-CM | POA: Diagnosis not present

## 2022-12-13 DIAGNOSIS — Z91040 Latex allergy status: Secondary | ICD-10-CM | POA: Diagnosis not present

## 2022-12-13 DIAGNOSIS — Z9889 Other specified postprocedural states: Secondary | ICD-10-CM | POA: Diagnosis not present

## 2022-12-13 DIAGNOSIS — Z66 Do not resuscitate: Secondary | ICD-10-CM | POA: Diagnosis present

## 2022-12-13 DIAGNOSIS — N1832 Chronic kidney disease, stage 3b: Secondary | ICD-10-CM | POA: Diagnosis not present

## 2022-12-13 DIAGNOSIS — D72829 Elevated white blood cell count, unspecified: Secondary | ICD-10-CM | POA: Diagnosis not present

## 2022-12-14 DIAGNOSIS — J189 Pneumonia, unspecified organism: Secondary | ICD-10-CM | POA: Diagnosis present

## 2022-12-14 DIAGNOSIS — A419 Sepsis, unspecified organism: Secondary | ICD-10-CM | POA: Diagnosis not present

## 2022-12-14 DIAGNOSIS — Z48817 Encounter for surgical aftercare following surgery on the skin and subcutaneous tissue: Secondary | ICD-10-CM | POA: Diagnosis not present

## 2022-12-14 DIAGNOSIS — Z882 Allergy status to sulfonamides status: Secondary | ICD-10-CM | POA: Diagnosis not present

## 2022-12-14 DIAGNOSIS — K219 Gastro-esophageal reflux disease without esophagitis: Secondary | ICD-10-CM | POA: Diagnosis present

## 2022-12-14 DIAGNOSIS — R300 Dysuria: Secondary | ICD-10-CM | POA: Diagnosis not present

## 2022-12-14 DIAGNOSIS — I129 Hypertensive chronic kidney disease with stage 1 through stage 4 chronic kidney disease, or unspecified chronic kidney disease: Secondary | ICD-10-CM | POA: Diagnosis present

## 2022-12-14 DIAGNOSIS — D631 Anemia in chronic kidney disease: Secondary | ICD-10-CM | POA: Diagnosis present

## 2022-12-14 DIAGNOSIS — N179 Acute kidney failure, unspecified: Secondary | ICD-10-CM | POA: Diagnosis present

## 2022-12-14 DIAGNOSIS — N289 Disorder of kidney and ureter, unspecified: Secondary | ICD-10-CM | POA: Diagnosis not present

## 2022-12-14 DIAGNOSIS — Q619 Cystic kidney disease, unspecified: Secondary | ICD-10-CM | POA: Diagnosis not present

## 2022-12-14 DIAGNOSIS — E871 Hypo-osmolality and hyponatremia: Secondary | ICD-10-CM | POA: Diagnosis present

## 2022-12-14 DIAGNOSIS — N151 Renal and perinephric abscess: Secondary | ICD-10-CM | POA: Diagnosis present

## 2022-12-14 DIAGNOSIS — E785 Hyperlipidemia, unspecified: Secondary | ICD-10-CM | POA: Diagnosis present

## 2022-12-14 DIAGNOSIS — Z66 Do not resuscitate: Secondary | ICD-10-CM | POA: Diagnosis present

## 2022-12-14 DIAGNOSIS — Z8249 Family history of ischemic heart disease and other diseases of the circulatory system: Secondary | ICD-10-CM | POA: Diagnosis not present

## 2022-12-14 DIAGNOSIS — Z20822 Contact with and (suspected) exposure to covid-19: Secondary | ICD-10-CM | POA: Diagnosis present

## 2022-12-14 DIAGNOSIS — N10 Acute tubulo-interstitial nephritis: Secondary | ICD-10-CM | POA: Diagnosis present

## 2022-12-14 DIAGNOSIS — E861 Hypovolemia: Secondary | ICD-10-CM | POA: Diagnosis present

## 2022-12-14 DIAGNOSIS — Z841 Family history of disorders of kidney and ureter: Secondary | ICD-10-CM | POA: Diagnosis not present

## 2022-12-14 DIAGNOSIS — M1990 Unspecified osteoarthritis, unspecified site: Secondary | ICD-10-CM | POA: Diagnosis present

## 2022-12-14 DIAGNOSIS — R059 Cough, unspecified: Secondary | ICD-10-CM | POA: Diagnosis not present

## 2022-12-14 DIAGNOSIS — J90 Pleural effusion, not elsewhere classified: Secondary | ICD-10-CM | POA: Diagnosis not present

## 2022-12-14 DIAGNOSIS — N12 Tubulo-interstitial nephritis, not specified as acute or chronic: Secondary | ICD-10-CM | POA: Diagnosis not present

## 2022-12-14 DIAGNOSIS — Z79899 Other long term (current) drug therapy: Secondary | ICD-10-CM | POA: Diagnosis not present

## 2022-12-14 DIAGNOSIS — Z90710 Acquired absence of both cervix and uterus: Secondary | ICD-10-CM | POA: Diagnosis not present

## 2022-12-14 DIAGNOSIS — Z888 Allergy status to other drugs, medicaments and biological substances status: Secondary | ICD-10-CM | POA: Diagnosis not present

## 2022-12-14 DIAGNOSIS — N281 Cyst of kidney, acquired: Secondary | ICD-10-CM | POA: Diagnosis not present

## 2022-12-14 DIAGNOSIS — Z974 Presence of external hearing-aid: Secondary | ICD-10-CM | POA: Diagnosis not present

## 2022-12-14 DIAGNOSIS — D72829 Elevated white blood cell count, unspecified: Secondary | ICD-10-CM | POA: Diagnosis not present

## 2022-12-14 DIAGNOSIS — Z9889 Other specified postprocedural states: Secondary | ICD-10-CM | POA: Diagnosis not present

## 2022-12-14 DIAGNOSIS — Z91040 Latex allergy status: Secondary | ICD-10-CM | POA: Diagnosis not present

## 2022-12-14 DIAGNOSIS — N183 Chronic kidney disease, stage 3 unspecified: Secondary | ICD-10-CM | POA: Diagnosis present

## 2022-12-14 DIAGNOSIS — R0602 Shortness of breath: Secondary | ICD-10-CM | POA: Diagnosis not present

## 2022-12-15 DIAGNOSIS — D72829 Elevated white blood cell count, unspecified: Secondary | ICD-10-CM | POA: Diagnosis not present

## 2022-12-15 DIAGNOSIS — E785 Hyperlipidemia, unspecified: Secondary | ICD-10-CM | POA: Diagnosis not present

## 2022-12-15 DIAGNOSIS — N12 Tubulo-interstitial nephritis, not specified as acute or chronic: Secondary | ICD-10-CM | POA: Diagnosis not present

## 2022-12-15 DIAGNOSIS — N183 Chronic kidney disease, stage 3 unspecified: Secondary | ICD-10-CM | POA: Diagnosis not present

## 2022-12-15 DIAGNOSIS — I129 Hypertensive chronic kidney disease with stage 1 through stage 4 chronic kidney disease, or unspecified chronic kidney disease: Secondary | ICD-10-CM | POA: Diagnosis not present

## 2022-12-15 DIAGNOSIS — K219 Gastro-esophageal reflux disease without esophagitis: Secondary | ICD-10-CM | POA: Diagnosis not present

## 2022-12-16 DIAGNOSIS — N12 Tubulo-interstitial nephritis, not specified as acute or chronic: Secondary | ICD-10-CM | POA: Diagnosis not present

## 2022-12-16 DIAGNOSIS — D72829 Elevated white blood cell count, unspecified: Secondary | ICD-10-CM | POA: Diagnosis not present

## 2022-12-16 DIAGNOSIS — E785 Hyperlipidemia, unspecified: Secondary | ICD-10-CM | POA: Diagnosis not present

## 2022-12-16 DIAGNOSIS — K219 Gastro-esophageal reflux disease without esophagitis: Secondary | ICD-10-CM | POA: Diagnosis not present

## 2022-12-16 DIAGNOSIS — N183 Chronic kidney disease, stage 3 unspecified: Secondary | ICD-10-CM | POA: Diagnosis not present

## 2022-12-16 DIAGNOSIS — I129 Hypertensive chronic kidney disease with stage 1 through stage 4 chronic kidney disease, or unspecified chronic kidney disease: Secondary | ICD-10-CM | POA: Diagnosis not present

## 2022-12-20 ENCOUNTER — Other Ambulatory Visit: Payer: Self-pay | Admitting: Physician Assistant

## 2022-12-20 DIAGNOSIS — N151 Renal and perinephric abscess: Secondary | ICD-10-CM

## 2022-12-21 DIAGNOSIS — A419 Sepsis, unspecified organism: Secondary | ICD-10-CM | POA: Diagnosis not present

## 2022-12-21 DIAGNOSIS — Z792 Long term (current) use of antibiotics: Secondary | ICD-10-CM | POA: Diagnosis not present

## 2022-12-21 DIAGNOSIS — M19042 Primary osteoarthritis, left hand: Secondary | ICD-10-CM | POA: Diagnosis not present

## 2022-12-21 DIAGNOSIS — E785 Hyperlipidemia, unspecified: Secondary | ICD-10-CM | POA: Diagnosis not present

## 2022-12-21 DIAGNOSIS — M19041 Primary osteoarthritis, right hand: Secondary | ICD-10-CM | POA: Diagnosis not present

## 2022-12-21 DIAGNOSIS — K219 Gastro-esophageal reflux disease without esophagitis: Secondary | ICD-10-CM | POA: Diagnosis not present

## 2022-12-21 DIAGNOSIS — I129 Hypertensive chronic kidney disease with stage 1 through stage 4 chronic kidney disease, or unspecified chronic kidney disease: Secondary | ICD-10-CM | POA: Diagnosis not present

## 2022-12-21 DIAGNOSIS — Z90710 Acquired absence of both cervix and uterus: Secondary | ICD-10-CM | POA: Diagnosis not present

## 2022-12-21 DIAGNOSIS — E871 Hypo-osmolality and hyponatremia: Secondary | ICD-10-CM | POA: Diagnosis not present

## 2022-12-21 DIAGNOSIS — R32 Unspecified urinary incontinence: Secondary | ICD-10-CM | POA: Diagnosis not present

## 2022-12-21 DIAGNOSIS — Z436 Encounter for attention to other artificial openings of urinary tract: Secondary | ICD-10-CM | POA: Diagnosis not present

## 2022-12-21 DIAGNOSIS — N812 Incomplete uterovaginal prolapse: Secondary | ICD-10-CM | POA: Diagnosis not present

## 2022-12-21 DIAGNOSIS — N183 Chronic kidney disease, stage 3 unspecified: Secondary | ICD-10-CM | POA: Diagnosis not present

## 2022-12-21 DIAGNOSIS — N12 Tubulo-interstitial nephritis, not specified as acute or chronic: Secondary | ICD-10-CM | POA: Diagnosis not present

## 2022-12-21 DIAGNOSIS — D631 Anemia in chronic kidney disease: Secondary | ICD-10-CM | POA: Diagnosis not present

## 2022-12-21 DIAGNOSIS — Z48816 Encounter for surgical aftercare following surgery on the genitourinary system: Secondary | ICD-10-CM | POA: Diagnosis not present

## 2022-12-21 DIAGNOSIS — M19079 Primary osteoarthritis, unspecified ankle and foot: Secondary | ICD-10-CM | POA: Diagnosis not present

## 2022-12-22 ENCOUNTER — Other Ambulatory Visit: Payer: Self-pay | Admitting: Interventional Radiology

## 2022-12-22 DIAGNOSIS — N151 Renal and perinephric abscess: Secondary | ICD-10-CM

## 2022-12-23 DIAGNOSIS — D75839 Thrombocytosis, unspecified: Secondary | ICD-10-CM | POA: Diagnosis not present

## 2022-12-23 DIAGNOSIS — N2889 Other specified disorders of kidney and ureter: Secondary | ICD-10-CM | POA: Diagnosis not present

## 2022-12-23 DIAGNOSIS — Z4682 Encounter for fitting and adjustment of non-vascular catheter: Secondary | ICD-10-CM | POA: Diagnosis not present

## 2022-12-23 DIAGNOSIS — R911 Solitary pulmonary nodule: Secondary | ICD-10-CM | POA: Diagnosis not present

## 2022-12-23 DIAGNOSIS — E871 Hypo-osmolality and hyponatremia: Secondary | ICD-10-CM | POA: Diagnosis present

## 2022-12-23 DIAGNOSIS — I129 Hypertensive chronic kidney disease with stage 1 through stage 4 chronic kidney disease, or unspecified chronic kidney disease: Secondary | ICD-10-CM | POA: Diagnosis present

## 2022-12-23 DIAGNOSIS — N1832 Chronic kidney disease, stage 3b: Secondary | ICD-10-CM | POA: Diagnosis present

## 2022-12-23 DIAGNOSIS — R7989 Other specified abnormal findings of blood chemistry: Secondary | ICD-10-CM | POA: Diagnosis present

## 2022-12-23 DIAGNOSIS — N12 Tubulo-interstitial nephritis, not specified as acute or chronic: Secondary | ICD-10-CM | POA: Diagnosis not present

## 2022-12-23 DIAGNOSIS — J90 Pleural effusion, not elsewhere classified: Secondary | ICD-10-CM | POA: Diagnosis not present

## 2022-12-23 DIAGNOSIS — D72829 Elevated white blood cell count, unspecified: Secondary | ICD-10-CM | POA: Diagnosis not present

## 2022-12-23 DIAGNOSIS — Z20822 Contact with and (suspected) exposure to covid-19: Secondary | ICD-10-CM | POA: Diagnosis present

## 2022-12-23 DIAGNOSIS — N151 Renal and perinephric abscess: Secondary | ICD-10-CM | POA: Diagnosis not present

## 2022-12-23 DIAGNOSIS — E785 Hyperlipidemia, unspecified: Secondary | ICD-10-CM | POA: Diagnosis not present

## 2022-12-23 DIAGNOSIS — J189 Pneumonia, unspecified organism: Secondary | ICD-10-CM | POA: Diagnosis present

## 2022-12-23 DIAGNOSIS — B9620 Unspecified Escherichia coli [E. coli] as the cause of diseases classified elsewhere: Secondary | ICD-10-CM | POA: Diagnosis not present

## 2022-12-23 DIAGNOSIS — Z9889 Other specified postprocedural states: Secondary | ICD-10-CM | POA: Diagnosis not present

## 2022-12-23 DIAGNOSIS — R059 Cough, unspecified: Secondary | ICD-10-CM | POA: Diagnosis not present

## 2022-12-23 DIAGNOSIS — J9811 Atelectasis: Secondary | ICD-10-CM | POA: Diagnosis not present

## 2022-12-23 DIAGNOSIS — R531 Weakness: Secondary | ICD-10-CM | POA: Diagnosis not present

## 2022-12-23 DIAGNOSIS — M25473 Effusion, unspecified ankle: Secondary | ICD-10-CM | POA: Diagnosis not present

## 2022-12-23 DIAGNOSIS — Z978 Presence of other specified devices: Secondary | ICD-10-CM | POA: Diagnosis not present

## 2022-12-23 DIAGNOSIS — R7401 Elevation of levels of liver transaminase levels: Secondary | ICD-10-CM | POA: Diagnosis not present

## 2022-12-23 DIAGNOSIS — K7689 Other specified diseases of liver: Secondary | ICD-10-CM | POA: Diagnosis not present

## 2022-12-24 DIAGNOSIS — J189 Pneumonia, unspecified organism: Secondary | ICD-10-CM | POA: Diagnosis not present

## 2022-12-24 DIAGNOSIS — I129 Hypertensive chronic kidney disease with stage 1 through stage 4 chronic kidney disease, or unspecified chronic kidney disease: Secondary | ICD-10-CM | POA: Diagnosis not present

## 2022-12-24 DIAGNOSIS — N1832 Chronic kidney disease, stage 3b: Secondary | ICD-10-CM | POA: Diagnosis not present

## 2022-12-24 DIAGNOSIS — R7401 Elevation of levels of liver transaminase levels: Secondary | ICD-10-CM | POA: Diagnosis not present

## 2022-12-24 DIAGNOSIS — D75839 Thrombocytosis, unspecified: Secondary | ICD-10-CM | POA: Diagnosis not present

## 2022-12-24 DIAGNOSIS — D72829 Elevated white blood cell count, unspecified: Secondary | ICD-10-CM | POA: Diagnosis not present

## 2022-12-24 DIAGNOSIS — E871 Hypo-osmolality and hyponatremia: Secondary | ICD-10-CM | POA: Diagnosis not present

## 2022-12-24 DIAGNOSIS — J90 Pleural effusion, not elsewhere classified: Secondary | ICD-10-CM | POA: Diagnosis not present

## 2022-12-24 DIAGNOSIS — N12 Tubulo-interstitial nephritis, not specified as acute or chronic: Secondary | ICD-10-CM | POA: Diagnosis not present

## 2022-12-25 DIAGNOSIS — J90 Pleural effusion, not elsewhere classified: Secondary | ICD-10-CM | POA: Diagnosis not present

## 2022-12-25 DIAGNOSIS — E871 Hypo-osmolality and hyponatremia: Secondary | ICD-10-CM | POA: Diagnosis not present

## 2022-12-25 DIAGNOSIS — N12 Tubulo-interstitial nephritis, not specified as acute or chronic: Secondary | ICD-10-CM | POA: Diagnosis not present

## 2022-12-25 DIAGNOSIS — N1832 Chronic kidney disease, stage 3b: Secondary | ICD-10-CM | POA: Diagnosis not present

## 2022-12-25 DIAGNOSIS — D72829 Elevated white blood cell count, unspecified: Secondary | ICD-10-CM | POA: Diagnosis not present

## 2022-12-25 DIAGNOSIS — J189 Pneumonia, unspecified organism: Secondary | ICD-10-CM | POA: Diagnosis not present

## 2022-12-25 DIAGNOSIS — R7401 Elevation of levels of liver transaminase levels: Secondary | ICD-10-CM | POA: Diagnosis not present

## 2022-12-25 DIAGNOSIS — I129 Hypertensive chronic kidney disease with stage 1 through stage 4 chronic kidney disease, or unspecified chronic kidney disease: Secondary | ICD-10-CM | POA: Diagnosis not present

## 2022-12-25 DIAGNOSIS — D75839 Thrombocytosis, unspecified: Secondary | ICD-10-CM | POA: Diagnosis not present

## 2022-12-26 DIAGNOSIS — D72829 Elevated white blood cell count, unspecified: Secondary | ICD-10-CM | POA: Diagnosis not present

## 2022-12-26 DIAGNOSIS — N1832 Chronic kidney disease, stage 3b: Secondary | ICD-10-CM | POA: Diagnosis not present

## 2022-12-26 DIAGNOSIS — J90 Pleural effusion, not elsewhere classified: Secondary | ICD-10-CM | POA: Diagnosis not present

## 2022-12-26 DIAGNOSIS — I129 Hypertensive chronic kidney disease with stage 1 through stage 4 chronic kidney disease, or unspecified chronic kidney disease: Secondary | ICD-10-CM | POA: Diagnosis not present

## 2022-12-26 DIAGNOSIS — N12 Tubulo-interstitial nephritis, not specified as acute or chronic: Secondary | ICD-10-CM | POA: Diagnosis not present

## 2022-12-26 DIAGNOSIS — E871 Hypo-osmolality and hyponatremia: Secondary | ICD-10-CM | POA: Diagnosis not present

## 2022-12-26 DIAGNOSIS — R7401 Elevation of levels of liver transaminase levels: Secondary | ICD-10-CM | POA: Diagnosis not present

## 2022-12-26 DIAGNOSIS — D75839 Thrombocytosis, unspecified: Secondary | ICD-10-CM | POA: Diagnosis not present

## 2022-12-26 DIAGNOSIS — J189 Pneumonia, unspecified organism: Secondary | ICD-10-CM | POA: Diagnosis not present

## 2022-12-27 DIAGNOSIS — J189 Pneumonia, unspecified organism: Secondary | ICD-10-CM | POA: Diagnosis not present

## 2022-12-27 DIAGNOSIS — J90 Pleural effusion, not elsewhere classified: Secondary | ICD-10-CM | POA: Diagnosis not present

## 2022-12-27 DIAGNOSIS — I129 Hypertensive chronic kidney disease with stage 1 through stage 4 chronic kidney disease, or unspecified chronic kidney disease: Secondary | ICD-10-CM | POA: Diagnosis not present

## 2022-12-27 DIAGNOSIS — N1832 Chronic kidney disease, stage 3b: Secondary | ICD-10-CM | POA: Diagnosis not present

## 2022-12-27 DIAGNOSIS — D72829 Elevated white blood cell count, unspecified: Secondary | ICD-10-CM | POA: Diagnosis not present

## 2022-12-27 DIAGNOSIS — E871 Hypo-osmolality and hyponatremia: Secondary | ICD-10-CM | POA: Diagnosis not present

## 2022-12-27 DIAGNOSIS — N12 Tubulo-interstitial nephritis, not specified as acute or chronic: Secondary | ICD-10-CM | POA: Diagnosis not present

## 2022-12-27 DIAGNOSIS — R7401 Elevation of levels of liver transaminase levels: Secondary | ICD-10-CM | POA: Diagnosis not present

## 2022-12-27 DIAGNOSIS — D75839 Thrombocytosis, unspecified: Secondary | ICD-10-CM | POA: Diagnosis not present

## 2022-12-28 ENCOUNTER — Other Ambulatory Visit: Payer: 59

## 2022-12-28 ENCOUNTER — Inpatient Hospital Stay: Admission: RE | Admit: 2022-12-28 | Payer: 59 | Source: Ambulatory Visit

## 2022-12-28 DIAGNOSIS — E871 Hypo-osmolality and hyponatremia: Secondary | ICD-10-CM | POA: Diagnosis not present

## 2022-12-28 DIAGNOSIS — N1832 Chronic kidney disease, stage 3b: Secondary | ICD-10-CM | POA: Diagnosis not present

## 2022-12-28 DIAGNOSIS — R7401 Elevation of levels of liver transaminase levels: Secondary | ICD-10-CM | POA: Diagnosis not present

## 2022-12-28 DIAGNOSIS — I129 Hypertensive chronic kidney disease with stage 1 through stage 4 chronic kidney disease, or unspecified chronic kidney disease: Secondary | ICD-10-CM | POA: Diagnosis not present

## 2022-12-28 DIAGNOSIS — D75839 Thrombocytosis, unspecified: Secondary | ICD-10-CM | POA: Diagnosis not present

## 2022-12-28 DIAGNOSIS — D72829 Elevated white blood cell count, unspecified: Secondary | ICD-10-CM | POA: Diagnosis not present

## 2022-12-28 DIAGNOSIS — N12 Tubulo-interstitial nephritis, not specified as acute or chronic: Secondary | ICD-10-CM | POA: Diagnosis not present

## 2022-12-28 DIAGNOSIS — J189 Pneumonia, unspecified organism: Secondary | ICD-10-CM | POA: Diagnosis not present

## 2022-12-28 DIAGNOSIS — J90 Pleural effusion, not elsewhere classified: Secondary | ICD-10-CM | POA: Diagnosis not present

## 2022-12-29 DIAGNOSIS — I129 Hypertensive chronic kidney disease with stage 1 through stage 4 chronic kidney disease, or unspecified chronic kidney disease: Secondary | ICD-10-CM | POA: Diagnosis not present

## 2022-12-29 DIAGNOSIS — E871 Hypo-osmolality and hyponatremia: Secondary | ICD-10-CM | POA: Diagnosis not present

## 2022-12-29 DIAGNOSIS — D72829 Elevated white blood cell count, unspecified: Secondary | ICD-10-CM | POA: Diagnosis not present

## 2022-12-29 DIAGNOSIS — Z978 Presence of other specified devices: Secondary | ICD-10-CM | POA: Diagnosis not present

## 2022-12-29 DIAGNOSIS — Z9889 Other specified postprocedural states: Secondary | ICD-10-CM | POA: Diagnosis not present

## 2022-12-29 DIAGNOSIS — N1832 Chronic kidney disease, stage 3b: Secondary | ICD-10-CM | POA: Diagnosis not present

## 2022-12-29 DIAGNOSIS — J9811 Atelectasis: Secondary | ICD-10-CM | POA: Diagnosis not present

## 2022-12-29 DIAGNOSIS — N151 Renal and perinephric abscess: Secondary | ICD-10-CM | POA: Diagnosis not present

## 2022-12-29 DIAGNOSIS — J189 Pneumonia, unspecified organism: Secondary | ICD-10-CM | POA: Diagnosis not present

## 2022-12-29 DIAGNOSIS — B9620 Unspecified Escherichia coli [E. coli] as the cause of diseases classified elsewhere: Secondary | ICD-10-CM | POA: Diagnosis not present

## 2022-12-29 DIAGNOSIS — J90 Pleural effusion, not elsewhere classified: Secondary | ICD-10-CM | POA: Diagnosis not present

## 2022-12-29 DIAGNOSIS — N12 Tubulo-interstitial nephritis, not specified as acute or chronic: Secondary | ICD-10-CM | POA: Diagnosis not present

## 2022-12-29 DIAGNOSIS — R7401 Elevation of levels of liver transaminase levels: Secondary | ICD-10-CM | POA: Diagnosis not present

## 2022-12-29 DIAGNOSIS — D75839 Thrombocytosis, unspecified: Secondary | ICD-10-CM | POA: Diagnosis not present

## 2022-12-30 DIAGNOSIS — E871 Hypo-osmolality and hyponatremia: Secondary | ICD-10-CM | POA: Diagnosis not present

## 2022-12-30 DIAGNOSIS — R7401 Elevation of levels of liver transaminase levels: Secondary | ICD-10-CM | POA: Diagnosis not present

## 2022-12-30 DIAGNOSIS — I129 Hypertensive chronic kidney disease with stage 1 through stage 4 chronic kidney disease, or unspecified chronic kidney disease: Secondary | ICD-10-CM | POA: Diagnosis not present

## 2022-12-30 DIAGNOSIS — Z978 Presence of other specified devices: Secondary | ICD-10-CM | POA: Diagnosis not present

## 2022-12-30 DIAGNOSIS — N1832 Chronic kidney disease, stage 3b: Secondary | ICD-10-CM | POA: Diagnosis not present

## 2022-12-30 DIAGNOSIS — J189 Pneumonia, unspecified organism: Secondary | ICD-10-CM | POA: Diagnosis not present

## 2022-12-30 DIAGNOSIS — N151 Renal and perinephric abscess: Secondary | ICD-10-CM | POA: Diagnosis not present

## 2022-12-30 DIAGNOSIS — J90 Pleural effusion, not elsewhere classified: Secondary | ICD-10-CM | POA: Diagnosis not present

## 2022-12-30 DIAGNOSIS — D75839 Thrombocytosis, unspecified: Secondary | ICD-10-CM | POA: Diagnosis not present

## 2022-12-30 DIAGNOSIS — N12 Tubulo-interstitial nephritis, not specified as acute or chronic: Secondary | ICD-10-CM | POA: Diagnosis not present

## 2022-12-30 DIAGNOSIS — B9620 Unspecified Escherichia coli [E. coli] as the cause of diseases classified elsewhere: Secondary | ICD-10-CM | POA: Diagnosis not present

## 2022-12-30 DIAGNOSIS — D72829 Elevated white blood cell count, unspecified: Secondary | ICD-10-CM | POA: Diagnosis not present

## 2022-12-30 DIAGNOSIS — Z9889 Other specified postprocedural states: Secondary | ICD-10-CM | POA: Diagnosis not present

## 2022-12-31 DIAGNOSIS — N151 Renal and perinephric abscess: Secondary | ICD-10-CM | POA: Diagnosis not present

## 2022-12-31 DIAGNOSIS — B9620 Unspecified Escherichia coli [E. coli] as the cause of diseases classified elsewhere: Secondary | ICD-10-CM | POA: Diagnosis not present

## 2022-12-31 DIAGNOSIS — D72829 Elevated white blood cell count, unspecified: Secondary | ICD-10-CM | POA: Diagnosis not present

## 2022-12-31 DIAGNOSIS — D75839 Thrombocytosis, unspecified: Secondary | ICD-10-CM | POA: Diagnosis not present

## 2022-12-31 DIAGNOSIS — N12 Tubulo-interstitial nephritis, not specified as acute or chronic: Secondary | ICD-10-CM | POA: Diagnosis not present

## 2022-12-31 DIAGNOSIS — Z9889 Other specified postprocedural states: Secondary | ICD-10-CM | POA: Diagnosis not present

## 2022-12-31 DIAGNOSIS — Z978 Presence of other specified devices: Secondary | ICD-10-CM | POA: Diagnosis not present

## 2022-12-31 DIAGNOSIS — N1832 Chronic kidney disease, stage 3b: Secondary | ICD-10-CM | POA: Diagnosis not present

## 2022-12-31 DIAGNOSIS — R7401 Elevation of levels of liver transaminase levels: Secondary | ICD-10-CM | POA: Diagnosis not present

## 2022-12-31 DIAGNOSIS — I129 Hypertensive chronic kidney disease with stage 1 through stage 4 chronic kidney disease, or unspecified chronic kidney disease: Secondary | ICD-10-CM | POA: Diagnosis not present

## 2022-12-31 DIAGNOSIS — J90 Pleural effusion, not elsewhere classified: Secondary | ICD-10-CM | POA: Diagnosis not present

## 2022-12-31 DIAGNOSIS — E871 Hypo-osmolality and hyponatremia: Secondary | ICD-10-CM | POA: Diagnosis not present

## 2022-12-31 DIAGNOSIS — J189 Pneumonia, unspecified organism: Secondary | ICD-10-CM | POA: Diagnosis not present

## 2023-01-03 DIAGNOSIS — A419 Sepsis, unspecified organism: Secondary | ICD-10-CM | POA: Diagnosis not present

## 2023-01-03 DIAGNOSIS — N183 Chronic kidney disease, stage 3 unspecified: Secondary | ICD-10-CM | POA: Diagnosis not present

## 2023-01-03 DIAGNOSIS — N12 Tubulo-interstitial nephritis, not specified as acute or chronic: Secondary | ICD-10-CM | POA: Diagnosis not present

## 2023-01-03 DIAGNOSIS — I129 Hypertensive chronic kidney disease with stage 1 through stage 4 chronic kidney disease, or unspecified chronic kidney disease: Secondary | ICD-10-CM | POA: Diagnosis not present

## 2023-01-03 DIAGNOSIS — Z48816 Encounter for surgical aftercare following surgery on the genitourinary system: Secondary | ICD-10-CM | POA: Diagnosis not present

## 2023-01-03 DIAGNOSIS — N812 Incomplete uterovaginal prolapse: Secondary | ICD-10-CM | POA: Diagnosis not present

## 2023-01-06 DIAGNOSIS — M25472 Effusion, left ankle: Secondary | ICD-10-CM | POA: Diagnosis not present

## 2023-01-06 DIAGNOSIS — J90 Pleural effusion, not elsewhere classified: Secondary | ICD-10-CM | POA: Diagnosis not present

## 2023-01-06 DIAGNOSIS — N184 Chronic kidney disease, stage 4 (severe): Secondary | ICD-10-CM | POA: Diagnosis not present

## 2023-01-06 DIAGNOSIS — E440 Moderate protein-calorie malnutrition: Secondary | ICD-10-CM | POA: Diagnosis not present

## 2023-01-06 DIAGNOSIS — D631 Anemia in chronic kidney disease: Secondary | ICD-10-CM | POA: Diagnosis not present

## 2023-01-06 DIAGNOSIS — R7989 Other specified abnormal findings of blood chemistry: Secondary | ICD-10-CM | POA: Diagnosis not present

## 2023-01-06 DIAGNOSIS — M25471 Effusion, right ankle: Secondary | ICD-10-CM | POA: Diagnosis not present

## 2023-01-06 DIAGNOSIS — J181 Lobar pneumonia, unspecified organism: Secondary | ICD-10-CM | POA: Diagnosis not present

## 2023-01-12 DIAGNOSIS — A419 Sepsis, unspecified organism: Secondary | ICD-10-CM | POA: Diagnosis not present

## 2023-01-12 DIAGNOSIS — N812 Incomplete uterovaginal prolapse: Secondary | ICD-10-CM | POA: Diagnosis not present

## 2023-01-12 DIAGNOSIS — I129 Hypertensive chronic kidney disease with stage 1 through stage 4 chronic kidney disease, or unspecified chronic kidney disease: Secondary | ICD-10-CM | POA: Diagnosis not present

## 2023-01-12 DIAGNOSIS — N183 Chronic kidney disease, stage 3 unspecified: Secondary | ICD-10-CM | POA: Diagnosis not present

## 2023-01-12 DIAGNOSIS — N12 Tubulo-interstitial nephritis, not specified as acute or chronic: Secondary | ICD-10-CM | POA: Diagnosis not present

## 2023-01-12 DIAGNOSIS — Z48816 Encounter for surgical aftercare following surgery on the genitourinary system: Secondary | ICD-10-CM | POA: Diagnosis not present

## 2023-01-14 DIAGNOSIS — A419 Sepsis, unspecified organism: Secondary | ICD-10-CM | POA: Diagnosis not present

## 2023-01-14 DIAGNOSIS — N812 Incomplete uterovaginal prolapse: Secondary | ICD-10-CM | POA: Diagnosis not present

## 2023-01-14 DIAGNOSIS — N183 Chronic kidney disease, stage 3 unspecified: Secondary | ICD-10-CM | POA: Diagnosis not present

## 2023-01-14 DIAGNOSIS — I129 Hypertensive chronic kidney disease with stage 1 through stage 4 chronic kidney disease, or unspecified chronic kidney disease: Secondary | ICD-10-CM | POA: Diagnosis not present

## 2023-01-14 DIAGNOSIS — N12 Tubulo-interstitial nephritis, not specified as acute or chronic: Secondary | ICD-10-CM | POA: Diagnosis not present

## 2023-01-14 DIAGNOSIS — Z48816 Encounter for surgical aftercare following surgery on the genitourinary system: Secondary | ICD-10-CM | POA: Diagnosis not present

## 2023-01-18 DIAGNOSIS — I509 Heart failure, unspecified: Secondary | ICD-10-CM | POA: Diagnosis not present

## 2023-01-18 DIAGNOSIS — N184 Chronic kidney disease, stage 4 (severe): Secondary | ICD-10-CM | POA: Diagnosis not present

## 2023-01-18 DIAGNOSIS — N1832 Chronic kidney disease, stage 3b: Secondary | ICD-10-CM | POA: Diagnosis not present

## 2023-01-18 DIAGNOSIS — E875 Hyperkalemia: Secondary | ICD-10-CM | POA: Diagnosis not present

## 2023-01-18 DIAGNOSIS — I10 Essential (primary) hypertension: Secondary | ICD-10-CM | POA: Diagnosis not present

## 2023-01-18 DIAGNOSIS — D631 Anemia in chronic kidney disease: Secondary | ICD-10-CM | POA: Diagnosis not present

## 2023-01-18 DIAGNOSIS — R809 Proteinuria, unspecified: Secondary | ICD-10-CM | POA: Diagnosis not present

## 2023-01-18 DIAGNOSIS — N281 Cyst of kidney, acquired: Secondary | ICD-10-CM | POA: Diagnosis not present

## 2023-01-18 DIAGNOSIS — K529 Noninfective gastroenteritis and colitis, unspecified: Secondary | ICD-10-CM | POA: Diagnosis not present

## 2023-01-18 DIAGNOSIS — R32 Unspecified urinary incontinence: Secondary | ICD-10-CM | POA: Diagnosis not present

## 2023-01-20 DIAGNOSIS — N1832 Chronic kidney disease, stage 3b: Secondary | ICD-10-CM | POA: Diagnosis not present

## 2023-01-20 DIAGNOSIS — N12 Tubulo-interstitial nephritis, not specified as acute or chronic: Secondary | ICD-10-CM | POA: Diagnosis not present

## 2023-01-20 DIAGNOSIS — M19042 Primary osteoarthritis, left hand: Secondary | ICD-10-CM | POA: Diagnosis not present

## 2023-01-20 DIAGNOSIS — Z79899 Other long term (current) drug therapy: Secondary | ICD-10-CM | POA: Diagnosis not present

## 2023-01-20 DIAGNOSIS — N3281 Overactive bladder: Secondary | ICD-10-CM | POA: Diagnosis not present

## 2023-01-20 DIAGNOSIS — Z9181 History of falling: Secondary | ICD-10-CM | POA: Diagnosis not present

## 2023-01-20 DIAGNOSIS — M19041 Primary osteoarthritis, right hand: Secondary | ICD-10-CM | POA: Diagnosis not present

## 2023-01-20 DIAGNOSIS — J181 Lobar pneumonia, unspecified organism: Secondary | ICD-10-CM | POA: Diagnosis not present

## 2023-01-20 DIAGNOSIS — A419 Sepsis, unspecified organism: Secondary | ICD-10-CM | POA: Diagnosis not present

## 2023-01-20 DIAGNOSIS — N812 Incomplete uterovaginal prolapse: Secondary | ICD-10-CM | POA: Diagnosis not present

## 2023-01-20 DIAGNOSIS — N819 Female genital prolapse, unspecified: Secondary | ICD-10-CM | POA: Diagnosis not present

## 2023-01-20 DIAGNOSIS — D631 Anemia in chronic kidney disease: Secondary | ICD-10-CM | POA: Diagnosis not present

## 2023-01-20 DIAGNOSIS — I129 Hypertensive chronic kidney disease with stage 1 through stage 4 chronic kidney disease, or unspecified chronic kidney disease: Secondary | ICD-10-CM | POA: Diagnosis not present

## 2023-01-20 DIAGNOSIS — K449 Diaphragmatic hernia without obstruction or gangrene: Secondary | ICD-10-CM | POA: Diagnosis not present

## 2023-01-20 DIAGNOSIS — Z90710 Acquired absence of both cervix and uterus: Secondary | ICD-10-CM | POA: Diagnosis not present

## 2023-01-20 DIAGNOSIS — N393 Stress incontinence (female) (male): Secondary | ICD-10-CM | POA: Diagnosis not present

## 2023-01-20 DIAGNOSIS — E785 Hyperlipidemia, unspecified: Secondary | ICD-10-CM | POA: Diagnosis not present

## 2023-01-20 DIAGNOSIS — M19079 Primary osteoarthritis, unspecified ankle and foot: Secondary | ICD-10-CM | POA: Diagnosis not present

## 2023-01-20 DIAGNOSIS — I700 Atherosclerosis of aorta: Secondary | ICD-10-CM | POA: Diagnosis not present

## 2023-01-20 DIAGNOSIS — M4186 Other forms of scoliosis, lumbar region: Secondary | ICD-10-CM | POA: Diagnosis not present

## 2023-01-20 DIAGNOSIS — E871 Hypo-osmolality and hyponatremia: Secondary | ICD-10-CM | POA: Diagnosis not present

## 2023-01-20 DIAGNOSIS — B9620 Unspecified Escherichia coli [E. coli] as the cause of diseases classified elsewhere: Secondary | ICD-10-CM | POA: Diagnosis not present

## 2023-01-20 DIAGNOSIS — K219 Gastro-esophageal reflux disease without esophagitis: Secondary | ICD-10-CM | POA: Diagnosis not present

## 2023-01-20 DIAGNOSIS — J90 Pleural effusion, not elsewhere classified: Secondary | ICD-10-CM | POA: Diagnosis not present

## 2023-01-20 DIAGNOSIS — N151 Renal and perinephric abscess: Secondary | ICD-10-CM | POA: Diagnosis not present

## 2023-01-21 DIAGNOSIS — N151 Renal and perinephric abscess: Secondary | ICD-10-CM | POA: Diagnosis not present

## 2023-01-21 DIAGNOSIS — B9620 Unspecified Escherichia coli [E. coli] as the cause of diseases classified elsewhere: Secondary | ICD-10-CM | POA: Diagnosis not present

## 2023-01-21 DIAGNOSIS — J90 Pleural effusion, not elsewhere classified: Secondary | ICD-10-CM | POA: Diagnosis not present

## 2023-01-21 DIAGNOSIS — N12 Tubulo-interstitial nephritis, not specified as acute or chronic: Secondary | ICD-10-CM | POA: Diagnosis not present

## 2023-01-21 DIAGNOSIS — J181 Lobar pneumonia, unspecified organism: Secondary | ICD-10-CM | POA: Diagnosis not present

## 2023-01-21 DIAGNOSIS — N812 Incomplete uterovaginal prolapse: Secondary | ICD-10-CM | POA: Diagnosis not present

## 2023-01-25 DIAGNOSIS — N281 Cyst of kidney, acquired: Secondary | ICD-10-CM | POA: Diagnosis not present

## 2023-01-25 DIAGNOSIS — N1832 Chronic kidney disease, stage 3b: Secondary | ICD-10-CM | POA: Diagnosis not present

## 2023-01-25 DIAGNOSIS — E875 Hyperkalemia: Secondary | ICD-10-CM | POA: Diagnosis not present

## 2023-01-25 DIAGNOSIS — R809 Proteinuria, unspecified: Secondary | ICD-10-CM | POA: Diagnosis not present

## 2023-01-25 DIAGNOSIS — D631 Anemia in chronic kidney disease: Secondary | ICD-10-CM | POA: Diagnosis not present

## 2023-01-25 DIAGNOSIS — R32 Unspecified urinary incontinence: Secondary | ICD-10-CM | POA: Diagnosis not present

## 2023-01-25 DIAGNOSIS — I10 Essential (primary) hypertension: Secondary | ICD-10-CM | POA: Diagnosis not present

## 2023-01-25 DIAGNOSIS — K529 Noninfective gastroenteritis and colitis, unspecified: Secondary | ICD-10-CM | POA: Diagnosis not present

## 2023-01-26 DIAGNOSIS — J90 Pleural effusion, not elsewhere classified: Secondary | ICD-10-CM | POA: Diagnosis not present

## 2023-01-26 DIAGNOSIS — N151 Renal and perinephric abscess: Secondary | ICD-10-CM | POA: Diagnosis not present

## 2023-01-26 DIAGNOSIS — N12 Tubulo-interstitial nephritis, not specified as acute or chronic: Secondary | ICD-10-CM | POA: Diagnosis not present

## 2023-01-26 DIAGNOSIS — J181 Lobar pneumonia, unspecified organism: Secondary | ICD-10-CM | POA: Diagnosis not present

## 2023-01-26 DIAGNOSIS — B9620 Unspecified Escherichia coli [E. coli] as the cause of diseases classified elsewhere: Secondary | ICD-10-CM | POA: Diagnosis not present

## 2023-01-26 DIAGNOSIS — N812 Incomplete uterovaginal prolapse: Secondary | ICD-10-CM | POA: Diagnosis not present

## 2023-01-28 DIAGNOSIS — J90 Pleural effusion, not elsewhere classified: Secondary | ICD-10-CM | POA: Diagnosis not present

## 2023-02-03 DIAGNOSIS — B9620 Unspecified Escherichia coli [E. coli] as the cause of diseases classified elsewhere: Secondary | ICD-10-CM | POA: Diagnosis not present

## 2023-02-03 DIAGNOSIS — N812 Incomplete uterovaginal prolapse: Secondary | ICD-10-CM | POA: Diagnosis not present

## 2023-02-03 DIAGNOSIS — N12 Tubulo-interstitial nephritis, not specified as acute or chronic: Secondary | ICD-10-CM | POA: Diagnosis not present

## 2023-02-03 DIAGNOSIS — J90 Pleural effusion, not elsewhere classified: Secondary | ICD-10-CM | POA: Diagnosis not present

## 2023-02-03 DIAGNOSIS — N151 Renal and perinephric abscess: Secondary | ICD-10-CM | POA: Diagnosis not present

## 2023-02-03 DIAGNOSIS — J181 Lobar pneumonia, unspecified organism: Secondary | ICD-10-CM | POA: Diagnosis not present

## 2023-02-04 DIAGNOSIS — N812 Incomplete uterovaginal prolapse: Secondary | ICD-10-CM | POA: Diagnosis not present

## 2023-02-04 DIAGNOSIS — N12 Tubulo-interstitial nephritis, not specified as acute or chronic: Secondary | ICD-10-CM | POA: Diagnosis not present

## 2023-02-04 DIAGNOSIS — N151 Renal and perinephric abscess: Secondary | ICD-10-CM | POA: Diagnosis not present

## 2023-02-04 DIAGNOSIS — B9620 Unspecified Escherichia coli [E. coli] as the cause of diseases classified elsewhere: Secondary | ICD-10-CM | POA: Diagnosis not present

## 2023-02-04 DIAGNOSIS — J90 Pleural effusion, not elsewhere classified: Secondary | ICD-10-CM | POA: Diagnosis not present

## 2023-02-04 DIAGNOSIS — J181 Lobar pneumonia, unspecified organism: Secondary | ICD-10-CM | POA: Diagnosis not present

## 2023-02-14 DIAGNOSIS — N12 Tubulo-interstitial nephritis, not specified as acute or chronic: Secondary | ICD-10-CM | POA: Diagnosis not present

## 2023-02-14 DIAGNOSIS — N151 Renal and perinephric abscess: Secondary | ICD-10-CM | POA: Diagnosis not present

## 2023-02-14 DIAGNOSIS — J181 Lobar pneumonia, unspecified organism: Secondary | ICD-10-CM | POA: Diagnosis not present

## 2023-02-14 DIAGNOSIS — B9620 Unspecified Escherichia coli [E. coli] as the cause of diseases classified elsewhere: Secondary | ICD-10-CM | POA: Diagnosis not present

## 2023-02-14 DIAGNOSIS — N812 Incomplete uterovaginal prolapse: Secondary | ICD-10-CM | POA: Diagnosis not present

## 2023-02-14 DIAGNOSIS — J90 Pleural effusion, not elsewhere classified: Secondary | ICD-10-CM | POA: Diagnosis not present

## 2023-02-16 DIAGNOSIS — B9620 Unspecified Escherichia coli [E. coli] as the cause of diseases classified elsewhere: Secondary | ICD-10-CM | POA: Diagnosis not present

## 2023-02-16 DIAGNOSIS — J90 Pleural effusion, not elsewhere classified: Secondary | ICD-10-CM | POA: Diagnosis not present

## 2023-02-16 DIAGNOSIS — N151 Renal and perinephric abscess: Secondary | ICD-10-CM | POA: Diagnosis not present

## 2023-02-16 DIAGNOSIS — N12 Tubulo-interstitial nephritis, not specified as acute or chronic: Secondary | ICD-10-CM | POA: Diagnosis not present

## 2023-02-16 DIAGNOSIS — J181 Lobar pneumonia, unspecified organism: Secondary | ICD-10-CM | POA: Diagnosis not present

## 2023-02-16 DIAGNOSIS — N812 Incomplete uterovaginal prolapse: Secondary | ICD-10-CM | POA: Diagnosis not present

## 2023-03-16 DIAGNOSIS — N390 Urinary tract infection, site not specified: Secondary | ICD-10-CM | POA: Diagnosis not present

## 2023-04-19 ENCOUNTER — Other Ambulatory Visit: Payer: Self-pay | Admitting: Family Medicine

## 2023-04-19 DIAGNOSIS — Z139 Encounter for screening, unspecified: Secondary | ICD-10-CM | POA: Diagnosis not present

## 2023-04-19 DIAGNOSIS — E782 Mixed hyperlipidemia: Secondary | ICD-10-CM | POA: Diagnosis not present

## 2023-04-19 DIAGNOSIS — Z1231 Encounter for screening mammogram for malignant neoplasm of breast: Secondary | ICD-10-CM

## 2023-04-19 DIAGNOSIS — I10 Essential (primary) hypertension: Secondary | ICD-10-CM | POA: Diagnosis not present

## 2023-04-19 DIAGNOSIS — R300 Dysuria: Secondary | ICD-10-CM | POA: Diagnosis not present

## 2023-04-19 DIAGNOSIS — N184 Chronic kidney disease, stage 4 (severe): Secondary | ICD-10-CM | POA: Diagnosis not present

## 2023-04-19 DIAGNOSIS — M810 Age-related osteoporosis without current pathological fracture: Secondary | ICD-10-CM | POA: Diagnosis not present

## 2023-04-19 DIAGNOSIS — Z6823 Body mass index (BMI) 23.0-23.9, adult: Secondary | ICD-10-CM | POA: Diagnosis not present

## 2023-05-04 DIAGNOSIS — H43813 Vitreous degeneration, bilateral: Secondary | ICD-10-CM | POA: Diagnosis not present

## 2023-05-04 DIAGNOSIS — H35371 Puckering of macula, right eye: Secondary | ICD-10-CM | POA: Diagnosis not present

## 2023-05-04 DIAGNOSIS — H34812 Central retinal vein occlusion, left eye, with macular edema: Secondary | ICD-10-CM | POA: Diagnosis not present

## 2023-05-04 DIAGNOSIS — H348122 Central retinal vein occlusion, left eye, stable: Secondary | ICD-10-CM | POA: Diagnosis not present

## 2023-05-17 DIAGNOSIS — N281 Cyst of kidney, acquired: Secondary | ICD-10-CM | POA: Diagnosis not present

## 2023-05-17 DIAGNOSIS — I10 Essential (primary) hypertension: Secondary | ICD-10-CM | POA: Diagnosis not present

## 2023-05-17 DIAGNOSIS — E875 Hyperkalemia: Secondary | ICD-10-CM | POA: Diagnosis not present

## 2023-05-17 DIAGNOSIS — R32 Unspecified urinary incontinence: Secondary | ICD-10-CM | POA: Diagnosis not present

## 2023-05-17 DIAGNOSIS — K529 Noninfective gastroenteritis and colitis, unspecified: Secondary | ICD-10-CM | POA: Diagnosis not present

## 2023-05-17 DIAGNOSIS — D631 Anemia in chronic kidney disease: Secondary | ICD-10-CM | POA: Diagnosis not present

## 2023-05-17 DIAGNOSIS — R809 Proteinuria, unspecified: Secondary | ICD-10-CM | POA: Diagnosis not present

## 2023-05-17 DIAGNOSIS — N1832 Chronic kidney disease, stage 3b: Secondary | ICD-10-CM | POA: Diagnosis not present

## 2023-05-19 ENCOUNTER — Ambulatory Visit: Payer: 59

## 2023-05-19 DIAGNOSIS — H43813 Vitreous degeneration, bilateral: Secondary | ICD-10-CM | POA: Diagnosis not present

## 2023-05-19 DIAGNOSIS — H35371 Puckering of macula, right eye: Secondary | ICD-10-CM | POA: Diagnosis not present

## 2023-05-19 DIAGNOSIS — H34812 Central retinal vein occlusion, left eye, with macular edema: Secondary | ICD-10-CM | POA: Diagnosis not present

## 2023-05-24 DIAGNOSIS — K529 Noninfective gastroenteritis and colitis, unspecified: Secondary | ICD-10-CM | POA: Diagnosis not present

## 2023-05-24 DIAGNOSIS — N1832 Chronic kidney disease, stage 3b: Secondary | ICD-10-CM | POA: Diagnosis not present

## 2023-05-24 DIAGNOSIS — D631 Anemia in chronic kidney disease: Secondary | ICD-10-CM | POA: Diagnosis not present

## 2023-05-24 DIAGNOSIS — R32 Unspecified urinary incontinence: Secondary | ICD-10-CM | POA: Diagnosis not present

## 2023-05-24 DIAGNOSIS — E875 Hyperkalemia: Secondary | ICD-10-CM | POA: Diagnosis not present

## 2023-05-24 DIAGNOSIS — R809 Proteinuria, unspecified: Secondary | ICD-10-CM | POA: Diagnosis not present

## 2023-05-24 DIAGNOSIS — I10 Essential (primary) hypertension: Secondary | ICD-10-CM | POA: Diagnosis not present

## 2023-05-24 DIAGNOSIS — N281 Cyst of kidney, acquired: Secondary | ICD-10-CM | POA: Diagnosis not present

## 2023-05-31 DIAGNOSIS — M810 Age-related osteoporosis without current pathological fracture: Secondary | ICD-10-CM | POA: Diagnosis not present

## 2023-06-08 ENCOUNTER — Ambulatory Visit
Admission: RE | Admit: 2023-06-08 | Discharge: 2023-06-08 | Disposition: A | Discharge status: Home / Self Care | Payer: Medicare Other | Source: Ambulatory Visit | Attending: Family Medicine | Admitting: Family Medicine

## 2023-06-08 DIAGNOSIS — Z1231 Encounter for screening mammogram for malignant neoplasm of breast: Secondary | ICD-10-CM | POA: Diagnosis not present

## 2023-06-08 DIAGNOSIS — N390 Urinary tract infection, site not specified: Secondary | ICD-10-CM | POA: Diagnosis not present

## 2023-06-16 DIAGNOSIS — H34812 Central retinal vein occlusion, left eye, with macular edema: Secondary | ICD-10-CM | POA: Diagnosis not present

## 2023-06-27 ENCOUNTER — Encounter: Payer: Self-pay | Admitting: Podiatry

## 2023-06-27 ENCOUNTER — Ambulatory Visit (INDEPENDENT_AMBULATORY_CARE_PROVIDER_SITE_OTHER): Payer: Medicare Other | Admitting: Podiatry

## 2023-06-27 DIAGNOSIS — M7751 Other enthesopathy of right foot: Secondary | ICD-10-CM | POA: Diagnosis not present

## 2023-06-27 MED ORDER — TRIAMCINOLONE ACETONIDE 10 MG/ML IJ SUSP
10.0000 mg | Freq: Once | INTRAMUSCULAR | Status: AC
Start: 2023-06-27 — End: 2023-06-27
  Administered 2023-06-27: 10 mg via INTRA_ARTICULAR

## 2023-06-27 NOTE — Progress Notes (Signed)
Subjective:   Patient ID: Rita Whitaker, female   DOB: 77 y.o.   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

## 2023-07-13 DIAGNOSIS — Z1331 Encounter for screening for depression: Secondary | ICD-10-CM | POA: Diagnosis not present

## 2023-07-13 DIAGNOSIS — Z139 Encounter for screening, unspecified: Secondary | ICD-10-CM | POA: Diagnosis not present

## 2023-07-13 DIAGNOSIS — Z0000 Encounter for general adult medical examination without abnormal findings: Secondary | ICD-10-CM | POA: Diagnosis not present

## 2023-07-13 DIAGNOSIS — Z9181 History of falling: Secondary | ICD-10-CM | POA: Diagnosis not present

## 2023-08-04 DIAGNOSIS — H35371 Puckering of macula, right eye: Secondary | ICD-10-CM | POA: Diagnosis not present

## 2023-08-04 DIAGNOSIS — H43813 Vitreous degeneration, bilateral: Secondary | ICD-10-CM | POA: Diagnosis not present

## 2023-08-04 DIAGNOSIS — H34812 Central retinal vein occlusion, left eye, with macular edema: Secondary | ICD-10-CM | POA: Diagnosis not present

## 2023-08-08 DIAGNOSIS — Z23 Encounter for immunization: Secondary | ICD-10-CM | POA: Diagnosis not present

## 2023-09-16 ENCOUNTER — Other Ambulatory Visit: Payer: Self-pay

## 2023-09-16 ENCOUNTER — Ambulatory Visit
Admission: EM | Admit: 2023-09-16 | Discharge: 2023-09-16 | Disposition: A | Discharge status: Home / Self Care | Payer: Medicare Other | Attending: Family Medicine | Admitting: Family Medicine

## 2023-09-16 DIAGNOSIS — N390 Urinary tract infection, site not specified: Secondary | ICD-10-CM | POA: Diagnosis not present

## 2023-09-16 LAB — POCT URINALYSIS DIP (MANUAL ENTRY)
Bilirubin, UA: NEGATIVE
Glucose, UA: NEGATIVE mg/dL
Ketones, POC UA: NEGATIVE mg/dL
Nitrite, UA: NEGATIVE
Protein Ur, POC: 100 mg/dL — AB
Spec Grav, UA: 1.01 (ref 1.010–1.025)
Urobilinogen, UA: 0.2 U/dL
pH, UA: 5.5 (ref 5.0–8.0)

## 2023-09-16 MED ORDER — CIPROFLOXACIN HCL 250 MG PO TABS: 250.0000 mg | ORAL_TABLET | Freq: Two times a day (BID) | ORAL | 0 refills | Status: AC

## 2023-09-16 NOTE — ED Triage Notes (Signed)
Presents for frequent urination with difficulty initiating a stream and low pelvic pain. Symptoms since Wednesday evening. States UTI's frequently.

## 2023-09-16 NOTE — Discharge Instructions (Signed)
You were seen today for a urinary tract infection.  I have sent out an antibiotic to take twice/day x 5 days.  I will culture the urine as well.  If you are not improving or worsening then please return for re-evaluation.  Please drink plenty of fluids to flush out your system.

## 2023-09-16 NOTE — ED Provider Notes (Signed)
[facility]    CSN: [id number] Arrival date & time: 09/16/23  1014      History   Chief Complaint Chief Complaint  Patient presents with   Urinary Frequency    HPI Rita Whitaker is a 77 y.o. female.    Urinary Frequency  Patient is here for possible UTI.  She started with symptoms about 2 days ago.  She is having urinary frequency, some low abd pain.  Mild back pain.  No fevers/chills.  No vomiting, some nausea.  She states she has h/o UTIs, this the 4th one this year.  She was hospitalized in 12/2022 for UTI.  Her last uti was 06/2023.        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      Home Medications    Prior to Admission medications   Medication Sig Start Date End Date Taking? Authorizing Provider  rosuvastatin (CRESTOR) 10 MG tablet  07/15/23  Yes [provider]  acetaminophen (TYLENOL) 650 MG CR tablet Take 650 mg every 8 (eight) hours as needed by mouth for pain.    [provider]  estradiol (ESTRACE) 0.1 MG/GM vaginal cream Place vaginally. 04/29/22   [provider]  famotidine (PEPCID) 20 MG tablet Take 20 mg by mouth daily.    [provider]  famotidine (PEPCID) 20 MG tablet Take by mouth.    [provider]  ferrous gluconate  (FERGON) 240 (27 FE) MG tablet Take 240 mg by mouth 2 (two) times daily.    [provider]  lisinopril-hydrochlorothiazide (ZESTORETIC) 20-25 MG tablet Take 1 tablet by mouth at bedtime. 08/20/20   [provider]  Menthol, Topical Analgesic, 4 % GEL Apply topically.    [provider]  Multiple Vitamins-Minerals (MULTIVITAMIN ADULTS 50+ PO) Take 1 tablet daily by mouth.    [provider]  solifenacin (VESICARE) 5 MG tablet Take 5 mg daily by mouth.    [provider]    Family History Family History  Problem Relation Age of Onset   Breast cancer Neg Hx     Social History Social History   Tobacco Use   Smoking status: Never   Smokeless tobacco: Never  Vaping Use   Vaping status: Never Used  Substance Use Topics   Alcohol use: Yes    Comment: occasional glass of wine   Drug use: Never     Allergies   Demerol [meperidine], Latex, Nitrofurantoin, and Sulfa antibiotics   Review of Systems Review of Systems  Constitutional: Negative.   HENT: Negative.    Respiratory: Negative.    Cardiovascular: Negative.   Gastrointestinal: Negative.   Genitourinary:  Positive for frequency.  Musculoskeletal: Negative.      Physical Exam  Triage Vital Signs ED Triage Vitals [09/16/23 1022]  Encounter Vitals Group     BP (!) 154/72     Systolic BP Percentile      Diastolic BP Percentile      Pulse Rate 87     Resp 16     Temp 98 F (36.7 C)     Temp Source Oral     SpO2      Weight      Height      Head Circumference      Peak Flow      Pain Score 0     Pain Loc      Pain Education      Exclude from Growth Chart    No data found.  Updated Vital Signs BP (!) 154/72 (BP Location: Left Arm)   Pulse 87   Temp 98 F (36.7 C) (Oral)   Resp 16   Visual Acuity Right Eye Distance:   Left Eye Distance:   Bilateral Distance:    Right Eye Near:   Left Eye Near:    Bilateral Near:     Physical Exam Constitutional:       Appearance: Normal appearance.  Cardiovascular:     Rate and Rhythm: Normal rate and regular rhythm.  Pulmonary:     Effort: Pulmonary effort is normal.     Breath sounds: Normal breath sounds.  Abdominal:     Palpations: Abdomen is soft.     Tenderness: There is abdominal tenderness in the suprapubic area.  Neurological:     Mental Status: She is alert.      UC Treatments / Results  Labs (all labs ordered are listed, but only abnormal results are displayed) Labs Reviewed  POCT URINALYSIS DIP (MANUAL ENTRY) - Abnormal; Notable for the following components:      Result Value   Clarity, UA cloudy (*)    Blood, UA large (*)    Protein Ur, POC =100 (*)    Leukocytes, UA Moderate (2+) (*)    All other components within normal limits  URINE CULTURE    EKG   Radiology No results found.  Procedures Procedures (including critical care time)  Medications Ordered in UC Medications - No data to display  Initial Impression / Assessment and Plan / UC Course  I have reviewed the triage vital signs and the nursing notes.  Pertinent labs & imaging results that were available during my care of the patient were reviewed by me and considered in my medical decision making (see chart for details).   Final Clinical Impressions(s) / UC Diagnoses   Final diagnoses:  Urinary tract infection without hematuria, site unspecified     Discharge Instructions      You were seen today for a urinary tract infection.  I have sent out an antibiotic to take twice/day x 5 days.  I will culture the urine as well.  If you are not improving or worsening then please return for re-evaluation.  Please drink plenty of fluids to flush out your system.     ED Prescriptions     Medication Sig Dispense Auth. Provider   ciprofloxacin (CIPRO) 250 MG tablet Take 1 tablet (250 mg total) by mouth every 12 (twelve) hours for 5 days. 10 tablet Jannifer Franklin, MD      PDMP not reviewed this encounter.    Jannifer Franklin, MD 09/16/23 1048

## 2023-09-17 LAB — URINE CULTURE

## 2023-09-20 DIAGNOSIS — I10 Essential (primary) hypertension: Secondary | ICD-10-CM | POA: Diagnosis not present

## 2023-09-20 DIAGNOSIS — R32 Unspecified urinary incontinence: Secondary | ICD-10-CM | POA: Diagnosis not present

## 2023-09-20 DIAGNOSIS — N1832 Chronic kidney disease, stage 3b: Secondary | ICD-10-CM | POA: Diagnosis not present

## 2023-09-20 DIAGNOSIS — N281 Cyst of kidney, acquired: Secondary | ICD-10-CM | POA: Diagnosis not present

## 2023-09-20 DIAGNOSIS — E875 Hyperkalemia: Secondary | ICD-10-CM | POA: Diagnosis not present

## 2023-09-20 DIAGNOSIS — D631 Anemia in chronic kidney disease: Secondary | ICD-10-CM | POA: Diagnosis not present

## 2023-09-20 DIAGNOSIS — K529 Noninfective gastroenteritis and colitis, unspecified: Secondary | ICD-10-CM | POA: Diagnosis not present

## 2023-09-20 DIAGNOSIS — R809 Proteinuria, unspecified: Secondary | ICD-10-CM | POA: Diagnosis not present

## 2023-09-27 DIAGNOSIS — D631 Anemia in chronic kidney disease: Secondary | ICD-10-CM | POA: Diagnosis not present

## 2023-09-27 DIAGNOSIS — E875 Hyperkalemia: Secondary | ICD-10-CM | POA: Diagnosis not present

## 2023-09-27 DIAGNOSIS — R32 Unspecified urinary incontinence: Secondary | ICD-10-CM | POA: Diagnosis not present

## 2023-09-27 DIAGNOSIS — N281 Cyst of kidney, acquired: Secondary | ICD-10-CM | POA: Diagnosis not present

## 2023-09-27 DIAGNOSIS — I10 Essential (primary) hypertension: Secondary | ICD-10-CM | POA: Diagnosis not present

## 2023-09-27 DIAGNOSIS — N1832 Chronic kidney disease, stage 3b: Secondary | ICD-10-CM | POA: Diagnosis not present

## 2023-09-27 DIAGNOSIS — K529 Noninfective gastroenteritis and colitis, unspecified: Secondary | ICD-10-CM | POA: Diagnosis not present

## 2023-09-27 DIAGNOSIS — R809 Proteinuria, unspecified: Secondary | ICD-10-CM | POA: Diagnosis not present

## 2023-10-06 DIAGNOSIS — H35033 Hypertensive retinopathy, bilateral: Secondary | ICD-10-CM | POA: Diagnosis not present

## 2023-10-06 DIAGNOSIS — H34812 Central retinal vein occlusion, left eye, with macular edema: Secondary | ICD-10-CM | POA: Diagnosis not present

## 2023-10-06 DIAGNOSIS — H43813 Vitreous degeneration, bilateral: Secondary | ICD-10-CM | POA: Diagnosis not present

## 2023-10-06 DIAGNOSIS — H35371 Puckering of macula, right eye: Secondary | ICD-10-CM | POA: Diagnosis not present

## 2023-10-20 DIAGNOSIS — R42 Dizziness and giddiness: Secondary | ICD-10-CM | POA: Diagnosis not present

## 2023-10-20 DIAGNOSIS — Z23 Encounter for immunization: Secondary | ICD-10-CM | POA: Diagnosis not present

## 2023-10-20 DIAGNOSIS — N390 Urinary tract infection, site not specified: Secondary | ICD-10-CM | POA: Diagnosis not present

## 2023-10-20 DIAGNOSIS — E782 Mixed hyperlipidemia: Secondary | ICD-10-CM | POA: Diagnosis not present

## 2023-10-20 DIAGNOSIS — I10 Essential (primary) hypertension: Secondary | ICD-10-CM | POA: Diagnosis not present

## 2023-10-20 DIAGNOSIS — M810 Age-related osteoporosis without current pathological fracture: Secondary | ICD-10-CM | POA: Diagnosis not present

## 2023-10-20 DIAGNOSIS — N184 Chronic kidney disease, stage 4 (severe): Secondary | ICD-10-CM | POA: Diagnosis not present

## 2023-11-03 DIAGNOSIS — Z79899 Other long term (current) drug therapy: Secondary | ICD-10-CM | POA: Diagnosis not present

## 2023-12-01 DIAGNOSIS — M810 Age-related osteoporosis without current pathological fracture: Secondary | ICD-10-CM | POA: Diagnosis not present

## 2023-12-16 ENCOUNTER — Other Ambulatory Visit: Payer: Self-pay

## 2023-12-16 ENCOUNTER — Ambulatory Visit
Admission: RE | Admit: 2023-12-16 | Discharge: 2023-12-16 | Disposition: A | Discharge status: Home / Self Care | Payer: Medicare Other | Source: Ambulatory Visit

## 2023-12-16 VITALS — BP 131/76 | HR 71 | Temp 98.0°F | Resp 18

## 2023-12-16 DIAGNOSIS — N3000 Acute cystitis without hematuria: Secondary | ICD-10-CM | POA: Diagnosis not present

## 2023-12-16 LAB — POCT URINALYSIS DIP (MANUAL ENTRY)
Bilirubin, UA: NEGATIVE
Glucose, UA: NEGATIVE mg/dL
Ketones, POC UA: NEGATIVE mg/dL
Nitrite, UA: NEGATIVE
Protein Ur, POC: NEGATIVE mg/dL
Spec Grav, UA: 1.015 (ref 1.010–1.025)
Urobilinogen, UA: 0.2 U/dL
pH, UA: 5.5 (ref 5.0–8.0)

## 2023-12-16 MED ORDER — CIPROFLOXACIN HCL 500 MG PO TABS: 500.0000 mg | ORAL_TABLET | Freq: Two times a day (BID) | ORAL | 0 refills | Status: DC

## 2023-12-16 NOTE — ED Triage Notes (Signed)
Pain, burning, nausea; probably UTI - Entered by patient  Pt reports UTI symptoms since Tuesday, frequency and pain

## 2023-12-16 NOTE — ED Provider Notes (Signed)
[facility]    CSN: [id number] Arrival date & time: 12/16/23  1354      History   Chief Complaint Chief Complaint  Patient presents with   Urinary Frequency    HPI Rita Whitaker is a 78 y.o. female.   Patient here today for evaluation of dysuria, nausea and mild suprapubic pain that started a few days ago.  She reports she has had increased urinary frequency as well.  She denies any vomiting or back pain.  She has not any fever.  The history is provided by the patient.  Urinary Frequency Pertinent negatives include no abdominal pain and no shortness of breath.    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      Home Medications    Prior to Admission medications   Medication Sig Start Date End Date Taking? Authorizing Provider  acetaminophen (TYLENOL) 650 MG CR tablet Take 650 mg every 8 (eight) hours as needed by mouth for pain.   Yes [provider]  amLODipine (NORVASC) 2.5 MG tablet Take 2.5 mg by mouth daily. 11/14/23  Yes [provider]  ciprofloxacin (CIPRO) 500 MG tablet Take 1 tablet (500 mg total) by mouth every 12 (twelve) hours. 12/16/23  Yes Tomi Bamberger, PA-C  estradiol (ESTRACE) 0.1 MG/GM vaginal cream Place vaginally. 04/29/22  Yes [provider]   famotidine (PEPCID) 20 MG tablet Take 20 mg by mouth daily.   Yes [provider]  ferrous gluconate (FERGON) 240 (27 FE) MG tablet Take 240 mg by mouth 2 (two) times daily.   Yes [provider]  lisinopril-hydrochlorothiazide (ZESTORETIC) 20-25 MG tablet Take 1 tablet by mouth at bedtime. 08/20/20  Yes [provider]  Multiple Vitamins-Minerals (MULTIVITAMIN ADULTS 50+ PO) Take 1 tablet daily by mouth.   Yes [provider]  rosuvastatin (CRESTOR) 10 MG tablet  07/15/23  Yes [provider]  solifenacin (VESICARE) 5 MG tablet Take 5 mg daily by mouth.   Yes [provider]  famotidine (PEPCID) 20 MG tablet Take by mouth.    [provider]  Menthol, Topical Analgesic, 4 % GEL Apply topically.    [provider]    Family History Family History  Problem Relation Age of Onset   Breast cancer Neg Hx     Social History Social History   Tobacco Use   Smoking status: Never   Smokeless tobacco: Never  Vaping Use   Vaping status: Never Used  Substance Use Topics   Alcohol use: Yes    Comment: occasional glass of wine   Drug use: Never     Allergies   Demerol [meperidine], Latex, Nitrofurantoin, and Sulfa antibiotics   Review of Systems Review of Systems  Constitutional:  Negative for chills and fever.  Eyes:  Negative for discharge and redness.  Respiratory:  Negative for shortness of breath.   Gastrointestinal:  Negative for abdominal pain, nausea and vomiting.  Genitourinary:  Positive for dysuria and frequency.     Physical Exam Triage Vital Signs ED Triage Vitals  Encounter Vitals Group     BP 12/16/23 1426 131/76     Systolic BP Percentile --      Diastolic BP Percentile --      Pulse Rate 12/16/23 1426 71     Resp 12/16/23 1426 18     Temp 12/16/23 1426 98 F (36.7 C)     Temp Source 12/16/23 1426 Oral     SpO2 12/16/23 1426 97 %     Weight --      Height --      Head Circumference --       Peak Flow --      Pain Score 12/16/23 1422 0     Pain Loc --      Pain Education --      Exclude from Growth Chart --    No data found.  Updated Vital Signs BP 131/76 (BP Location: Left Arm)   Pulse 71   Temp 98 F (36.7 C) (Oral)   Resp 18   SpO2 97%   Visual Acuity Right Eye Distance:   Left Eye Distance:   Bilateral Distance:    Right Eye Near:   Left Eye Near:    Bilateral Near:     Physical Exam Vitals and nursing note reviewed.  Constitutional:      General: She is not in acute distress.    Appearance: Normal appearance. She is not ill-appearing.  HENT:     Head: Normocephalic and atraumatic.  Eyes:     Conjunctiva/sclera: Conjunctivae normal.  Cardiovascular:     Rate and Rhythm: Normal rate.  Pulmonary:     Effort: Pulmonary effort is normal. No respiratory distress.  Neurological:     Mental Status: She is alert.  Psychiatric:        Mood and Affect: Mood normal.        Behavior: Behavior normal.        Thought Content: Thought content normal.      UC Treatments / Results  Labs (all labs ordered are listed, but only abnormal results are displayed) Labs Reviewed  POCT URINALYSIS DIP (MANUAL ENTRY) - Abnormal; Notable for the following components:      Result Value   Clarity, UA cloudy (*)    Blood, UA trace-intact (*)    Leukocytes, UA Small (1+) (*)    All other components within normal limits  URINE CULTURE    EKG   Radiology No results found.  Procedures Procedures (including critical care time)  Medications Ordered in UC Medications - No data to display  Initial Impression / Assessment and Plan / UC Course  I have reviewed the triage vital signs and the nursing notes.  Pertinent labs & imaging results that were available during my care of the patient were reviewed by me and considered in my medical decision making (see chart for details).    Cipro prescribed to cover UTI and urine culture ordered.  Recommended follow-up  if no gradual improvement with any further concerns.  Final Clinical Impressions(s) / UC Diagnoses   Final diagnoses:  Acute cystitis without hematuria   Discharge Instructions   None    ED Prescriptions  Medication Sig Dispense Auth. Provider   ciprofloxacin (CIPRO) 500 MG tablet Take 1 tablet (500 mg total) by mouth every 12 (twelve) hours. 10 tablet Tomi Bamberger, PA-C      PDMP not reviewed this encounter.   Tomi Bamberger, PA-C 12/16/23 [phone]

## 2023-12-16 NOTE — ED Notes (Signed)
Pt unable to provide enough sample for urine culture

## 2023-12-21 ENCOUNTER — Ambulatory Visit: Payer: Medicare Other | Admitting: Podiatry

## 2023-12-29 DIAGNOSIS — H43813 Vitreous degeneration, bilateral: Secondary | ICD-10-CM | POA: Diagnosis not present

## 2023-12-29 DIAGNOSIS — H35033 Hypertensive retinopathy, bilateral: Secondary | ICD-10-CM | POA: Diagnosis not present

## 2023-12-29 DIAGNOSIS — H35371 Puckering of macula, right eye: Secondary | ICD-10-CM | POA: Diagnosis not present

## 2023-12-29 DIAGNOSIS — H34812 Central retinal vein occlusion, left eye, with macular edema: Secondary | ICD-10-CM | POA: Diagnosis not present

## 2024-01-04 ENCOUNTER — Encounter: Payer: Self-pay | Admitting: Podiatry

## 2024-01-04 ENCOUNTER — Ambulatory Visit (INDEPENDENT_AMBULATORY_CARE_PROVIDER_SITE_OTHER)

## 2024-01-04 ENCOUNTER — Ambulatory Visit (INDEPENDENT_AMBULATORY_CARE_PROVIDER_SITE_OTHER): Payer: Medicare Other | Admitting: Podiatry

## 2024-01-04 VITALS — Ht 60.0 in | Wt 121.0 lb

## 2024-01-04 DIAGNOSIS — M7751 Other enthesopathy of right foot: Secondary | ICD-10-CM

## 2024-01-04 DIAGNOSIS — M779 Enthesopathy, unspecified: Secondary | ICD-10-CM

## 2024-01-04 MED ORDER — TRIAMCINOLONE ACETONIDE 10 MG/ML IJ SUSP
10.0000 mg | Freq: Once | INTRAMUSCULAR | Status: AC
  Administered 2024-01-04: 10 mg via INTRA_ARTICULAR

## 2024-01-04 NOTE — Progress Notes (Signed)
 Subjective:   Patient ID: Rita Whitaker, female   DOB: 78 y.o.   MRN: [id number]   HPI Patient presents stating that she started develop pain in her right ankle and it has really been going on for a number of months but she was not able to make it back.  States it did get better for a good period of time neuro vas   ROS      Objective:  Physical Exam  Neuro status intact inflammation pain of the sinus tarsi right with fluid buildup and into the peroneal tendon group     Assessment:  Probable progress of the subtalar joint right extending into the lateral ankle gutter     Plan:  H&P x-ray reviewed sterile prep injected the sinus tarsi right 3 mg Kenalog 5 mg Xylocaine carefully injected the tendon complex for the same incision and lateral ankle gutter also  X-rays indicate there is moderate arthritis no indication of advancement deformity

## 2024-01-18 DIAGNOSIS — E875 Hyperkalemia: Secondary | ICD-10-CM | POA: Diagnosis not present

## 2024-01-18 DIAGNOSIS — R809 Proteinuria, unspecified: Secondary | ICD-10-CM | POA: Diagnosis not present

## 2024-01-18 DIAGNOSIS — N1832 Chronic kidney disease, stage 3b: Secondary | ICD-10-CM | POA: Diagnosis not present

## 2024-01-18 DIAGNOSIS — N281 Cyst of kidney, acquired: Secondary | ICD-10-CM | POA: Diagnosis not present

## 2024-01-18 DIAGNOSIS — I10 Essential (primary) hypertension: Secondary | ICD-10-CM | POA: Diagnosis not present

## 2024-01-18 DIAGNOSIS — R32 Unspecified urinary incontinence: Secondary | ICD-10-CM | POA: Diagnosis not present

## 2024-01-18 DIAGNOSIS — K529 Noninfective gastroenteritis and colitis, unspecified: Secondary | ICD-10-CM | POA: Diagnosis not present

## 2024-01-18 DIAGNOSIS — D631 Anemia in chronic kidney disease: Secondary | ICD-10-CM | POA: Diagnosis not present

## 2024-01-25 DIAGNOSIS — I10 Essential (primary) hypertension: Secondary | ICD-10-CM | POA: Diagnosis not present

## 2024-01-25 DIAGNOSIS — E875 Hyperkalemia: Secondary | ICD-10-CM | POA: Diagnosis not present

## 2024-01-25 DIAGNOSIS — K529 Noninfective gastroenteritis and colitis, unspecified: Secondary | ICD-10-CM | POA: Diagnosis not present

## 2024-01-25 DIAGNOSIS — R809 Proteinuria, unspecified: Secondary | ICD-10-CM | POA: Diagnosis not present

## 2024-01-25 DIAGNOSIS — N1832 Chronic kidney disease, stage 3b: Secondary | ICD-10-CM | POA: Diagnosis not present

## 2024-01-25 DIAGNOSIS — N281 Cyst of kidney, acquired: Secondary | ICD-10-CM | POA: Diagnosis not present

## 2024-01-25 DIAGNOSIS — D631 Anemia in chronic kidney disease: Secondary | ICD-10-CM | POA: Diagnosis not present

## 2024-01-25 DIAGNOSIS — R32 Unspecified urinary incontinence: Secondary | ICD-10-CM | POA: Diagnosis not present

## 2024-02-23 DIAGNOSIS — H34812 Central retinal vein occlusion, left eye, with macular edema: Secondary | ICD-10-CM | POA: Diagnosis not present

## 2024-02-23 DIAGNOSIS — H43813 Vitreous degeneration, bilateral: Secondary | ICD-10-CM | POA: Diagnosis not present

## 2024-02-23 DIAGNOSIS — H35371 Puckering of macula, right eye: Secondary | ICD-10-CM | POA: Diagnosis not present

## 2024-02-23 DIAGNOSIS — H35033 Hypertensive retinopathy, bilateral: Secondary | ICD-10-CM | POA: Diagnosis not present

## 2024-03-27 DIAGNOSIS — N1832 Chronic kidney disease, stage 3b: Secondary | ICD-10-CM | POA: Diagnosis not present

## 2024-03-27 DIAGNOSIS — N390 Urinary tract infection, site not specified: Secondary | ICD-10-CM | POA: Diagnosis not present

## 2024-04-24 DIAGNOSIS — R350 Frequency of micturition: Secondary | ICD-10-CM | POA: Diagnosis not present

## 2024-04-24 DIAGNOSIS — R059 Cough, unspecified: Secondary | ICD-10-CM | POA: Diagnosis not present

## 2024-04-24 DIAGNOSIS — I10 Essential (primary) hypertension: Secondary | ICD-10-CM | POA: Diagnosis not present

## 2024-04-24 DIAGNOSIS — N390 Urinary tract infection, site not specified: Secondary | ICD-10-CM | POA: Diagnosis not present

## 2024-04-24 DIAGNOSIS — N1832 Chronic kidney disease, stage 3b: Secondary | ICD-10-CM | POA: Diagnosis not present

## 2024-04-24 DIAGNOSIS — E782 Mixed hyperlipidemia: Secondary | ICD-10-CM | POA: Diagnosis not present

## 2024-04-24 DIAGNOSIS — M810 Age-related osteoporosis without current pathological fracture: Secondary | ICD-10-CM | POA: Diagnosis not present

## 2024-04-26 DIAGNOSIS — H43813 Vitreous degeneration, bilateral: Secondary | ICD-10-CM | POA: Diagnosis not present

## 2024-04-26 DIAGNOSIS — H34812 Central retinal vein occlusion, left eye, with macular edema: Secondary | ICD-10-CM | POA: Diagnosis not present

## 2024-04-26 DIAGNOSIS — H35033 Hypertensive retinopathy, bilateral: Secondary | ICD-10-CM | POA: Diagnosis not present

## 2024-04-26 DIAGNOSIS — H35371 Puckering of macula, right eye: Secondary | ICD-10-CM | POA: Diagnosis not present

## 2024-05-10 DIAGNOSIS — Z79899 Other long term (current) drug therapy: Secondary | ICD-10-CM | POA: Diagnosis not present

## 2024-05-10 DIAGNOSIS — M810 Age-related osteoporosis without current pathological fracture: Secondary | ICD-10-CM | POA: Diagnosis not present

## 2024-05-10 DIAGNOSIS — N1832 Chronic kidney disease, stage 3b: Secondary | ICD-10-CM | POA: Diagnosis not present

## 2024-05-10 DIAGNOSIS — N390 Urinary tract infection, site not specified: Secondary | ICD-10-CM | POA: Diagnosis not present

## 2024-05-17 ENCOUNTER — Ambulatory Visit (INDEPENDENT_AMBULATORY_CARE_PROVIDER_SITE_OTHER): Admitting: Podiatry

## 2024-05-17 ENCOUNTER — Encounter: Payer: Self-pay | Admitting: Podiatry

## 2024-05-17 DIAGNOSIS — M7751 Other enthesopathy of right foot: Secondary | ICD-10-CM

## 2024-05-17 MED ORDER — TRIAMCINOLONE ACETONIDE 10 MG/ML IJ SUSP
10.0000 mg | Freq: Once | INTRAMUSCULAR | Status: AC
  Administered 2024-05-17: 10 mg via INTRA_ARTICULAR

## 2024-05-17 NOTE — Progress Notes (Signed)
 Subjective:   Patient ID: Rita Whitaker Mo, female   DOB: 78 y.o.   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  May require other treatments depending on response

## 2024-05-30 DIAGNOSIS — M810 Age-related osteoporosis without current pathological fracture: Secondary | ICD-10-CM | POA: Diagnosis not present

## 2024-05-30 DIAGNOSIS — K529 Noninfective gastroenteritis and colitis, unspecified: Secondary | ICD-10-CM | POA: Diagnosis not present

## 2024-05-30 DIAGNOSIS — R809 Proteinuria, unspecified: Secondary | ICD-10-CM | POA: Diagnosis not present

## 2024-05-30 DIAGNOSIS — D631 Anemia in chronic kidney disease: Secondary | ICD-10-CM | POA: Diagnosis not present

## 2024-05-30 DIAGNOSIS — I10 Essential (primary) hypertension: Secondary | ICD-10-CM | POA: Diagnosis not present

## 2024-05-30 DIAGNOSIS — N281 Cyst of kidney, acquired: Secondary | ICD-10-CM | POA: Diagnosis not present

## 2024-05-30 DIAGNOSIS — E875 Hyperkalemia: Secondary | ICD-10-CM | POA: Diagnosis not present

## 2024-05-30 DIAGNOSIS — R32 Unspecified urinary incontinence: Secondary | ICD-10-CM | POA: Diagnosis not present

## 2024-05-30 DIAGNOSIS — N1832 Chronic kidney disease, stage 3b: Secondary | ICD-10-CM | POA: Diagnosis not present

## 2024-06-06 DIAGNOSIS — R32 Unspecified urinary incontinence: Secondary | ICD-10-CM | POA: Diagnosis not present

## 2024-06-06 DIAGNOSIS — N1832 Chronic kidney disease, stage 3b: Secondary | ICD-10-CM | POA: Diagnosis not present

## 2024-06-06 DIAGNOSIS — R809 Proteinuria, unspecified: Secondary | ICD-10-CM | POA: Diagnosis not present

## 2024-06-06 DIAGNOSIS — E875 Hyperkalemia: Secondary | ICD-10-CM | POA: Diagnosis not present

## 2024-06-06 DIAGNOSIS — D631 Anemia in chronic kidney disease: Secondary | ICD-10-CM | POA: Diagnosis not present

## 2024-06-06 DIAGNOSIS — I10 Essential (primary) hypertension: Secondary | ICD-10-CM | POA: Diagnosis not present

## 2024-06-06 DIAGNOSIS — N281 Cyst of kidney, acquired: Secondary | ICD-10-CM | POA: Diagnosis not present

## 2024-06-06 DIAGNOSIS — K529 Noninfective gastroenteritis and colitis, unspecified: Secondary | ICD-10-CM | POA: Diagnosis not present

## 2024-06-28 DIAGNOSIS — H35371 Puckering of macula, right eye: Secondary | ICD-10-CM | POA: Diagnosis not present

## 2024-06-28 DIAGNOSIS — H43813 Vitreous degeneration, bilateral: Secondary | ICD-10-CM | POA: Diagnosis not present

## 2024-06-28 DIAGNOSIS — H35033 Hypertensive retinopathy, bilateral: Secondary | ICD-10-CM | POA: Diagnosis not present

## 2024-06-28 DIAGNOSIS — H34812 Central retinal vein occlusion, left eye, with macular edema: Secondary | ICD-10-CM | POA: Diagnosis not present

## 2024-08-02 DIAGNOSIS — R399 Unspecified symptoms and signs involving the genitourinary system: Secondary | ICD-10-CM | POA: Diagnosis not present

## 2024-08-02 DIAGNOSIS — R3129 Other microscopic hematuria: Secondary | ICD-10-CM | POA: Diagnosis not present

## 2024-08-02 DIAGNOSIS — N1832 Chronic kidney disease, stage 3b: Secondary | ICD-10-CM | POA: Diagnosis not present

## 2024-08-17 ENCOUNTER — Encounter: Payer: Self-pay | Admitting: Podiatry

## 2024-08-17 ENCOUNTER — Ambulatory Visit (INDEPENDENT_AMBULATORY_CARE_PROVIDER_SITE_OTHER): Admitting: Podiatry

## 2024-08-17 DIAGNOSIS — M7751 Other enthesopathy of right foot: Secondary | ICD-10-CM

## 2024-08-17 MED ORDER — TRIAMCINOLONE ACETONIDE 10 MG/ML IJ SUSP
10.0000 mg | Freq: Once | INTRAMUSCULAR | Status: AC
  Administered 2024-08-17: 10 mg via INTRA_ARTICULAR

## 2024-08-17 NOTE — Progress Notes (Unsigned)
RM3

## 2024-08-21 DIAGNOSIS — Z23 Encounter for immunization: Secondary | ICD-10-CM | POA: Diagnosis not present

## 2024-08-23 NOTE — Progress Notes (Signed)
 08/30/24 8:42 AM   Rita Whitaker Mo 03-Aug-1946 [id number]   HPI: 78 y.o. female here for initial evaluation of hematuria Very pleasant lady Unfortunately, no referral documents or scanned records She reports a history of recurrent UTIs, uptick in the last 2 years 3-4 UTIs annually Symptoms include acute dysuria, frequency, urgency, UUI Antibiotic treatment results and rapid resolution Seen through [facility], most of her urine studies and antibiotic treatments Also saw nephrology in August 2025 -stage III CKD, history of bilaterally atrophic kidneys  Urine culture (October 2025, by patient report) -Klebsiella, treated with Cipro  250, still currently taking UA (July 2025) - negative UA (Mar 2025) - negative  Previously saw Urogyn via [facility] in 2024 - colpocleisis   - c/b perinephric abscess post-op, pneumonia and effusion requiring chest tube   - Also, s/p [facility] in Jan 2024   - ongoing SUI, wears depends   - on 5mg  Vesicare      PMH: Past Medical History:  Diagnosis Date   Anemia    Arthritis    neck, foot, hands   Chronic kidney disease    stage 4   GERD (gastroesophageal reflux disease)    Hypertension     Surgical History: Past Surgical History:  Procedure Laterality Date   ABDOMINAL HYSTERECTOMY     ANTERIOR CERVICAL DECOMP/DISCECTOMY FUSION N/A 04/29/2021   Procedure: CERVICAL SIX CORPECTOMY;  Surgeon: Gillie Duncans, MD;  Location: [facility] OR;  Service: Neurosurgery;  Laterality: N/A;   CATARACT EXTRACTION Bilateral    COLONOSCOPY  12/2020   EYE SURGERY Bilateral    cataract removal    Family History: Family History  Problem Relation Age of Onset   Breast cancer Neg Hx     Social History:  reports that she has never smoked. She has never used smokeless tobacco. She reports current alcohol use. She reports that she does not use drugs.      Physical Exam: BP (!) 167/76   Pulse 66   Ht 4' 11 (1.499 m)   Wt 120 lb (54.4 kg)   BMI 24.24  kg/m    Constitutional:  Alert and oriented, No acute distress. Cardiovascular: No clubbing, cyanosis, or edema. Respiratory: Normal respiratory effort, no increased work of breathing. GI: Nondistended Skin: No rashes, bruises or suspicious lesions. Neurologic: Grossly intact, no focal deficits, moving all 4 extremities. Psychiatric: Normal mood and affect.  Laboratory Data: UA (July 2025) - negative   Pertinent Imaging: N/A    Assessment & Plan:    Hematuria, unspecified type  Recurrent UTI Assessment & Plan: Reports 3-4 UTI / year No culture data or referral records Hx of colpocleisis c/b pneumonia, Left perinephric abscess (Urogyn in [place] in 2024)  PVR 6 ml today   Multiple negative UAs earlier this year in our system   We reviewed the core principles of recurrent UTI management in women, including relevant physiology, proper diagnostic evaluation, and the full spectrum of treatment options. I outlined the range of management strategies, noting the varying degrees of evidence supporting each. For many women, attention to lifestyle and conservative behavioral measures can significantly reduce infection frequency and improve quality of life. First-line steps include maintaining adequate hydration, avoiding constipation, and practicing proper bladder hygiene. Over-the-counter supplements such as cranberry juice or extracts may be incorporated into the diet, as they have demonstrated benefit in reducing UTI frequency. Other strategies with more limited evidence--but low risk and ease of implementation--include D-mannose and probiotics.  For postmenopausal women, we discussed the  use of vaginal estrogen, its role in addressing vaginal atrophy, restoring urethrogenital flora, and its evidence-based benefit in reducing recurrent UTIs. In refractory cases, options may include a trial of suppressive antibiotic therapy with low-dose daily administration. Alternative approaches  include post-coital antibiotic prophylaxis or a "pill-in-the-pocket" regimen for self-treatment in select uncomplicated cases.  - Recommend follow-up with her operating surgeon in urogynecology - We may continue to follow and help workup for UTIs concurrently - Would appreciate fax of outside record /prior culture data and antibiotic treatments - Future UTI workup-she needs to obtain urinalysis and urine culture prior to initiation of antibiotics  - As she had previously tried vaginal estrogen, we will restart this today. Otherwise, will establish formal UTI culture data - Follow up UA today - she is at urinary baseline, no acute LUTS  Orders: -     Urinalysis, Complete -     BLADDER SCAN AMB NON-IMAGING -     Estradiol; Place 1 Applicatorful vaginally 3 (three) times a week.  Dispense: 42.5 g; Refill: 12      Penne Skye, MD 08/30/2024  [facility] [address] [place], [place] [address] [phone]

## 2024-08-30 ENCOUNTER — Ambulatory Visit (INDEPENDENT_AMBULATORY_CARE_PROVIDER_SITE_OTHER): Admitting: Urology

## 2024-08-30 VITALS — BP 167/76 | HR 66 | Ht 59.0 in | Wt 120.0 lb

## 2024-08-30 DIAGNOSIS — N390 Urinary tract infection, site not specified: Secondary | ICD-10-CM | POA: Diagnosis not present

## 2024-08-30 DIAGNOSIS — R319 Hematuria, unspecified: Secondary | ICD-10-CM

## 2024-08-30 LAB — URINALYSIS, COMPLETE
Bilirubin, UA: NEGATIVE
Glucose, UA: NEGATIVE
Ketones, UA: NEGATIVE
Leukocytes,UA: NEGATIVE
Nitrite, UA: NEGATIVE
Protein,UA: NEGATIVE
RBC, UA: NEGATIVE
Specific Gravity, UA: <1.005 — ABNORMAL LOW (ref 1.005–1.030)
Urobilinogen, Ur: 0.2 mg/dL (ref 0.2–1.0)
pH, UA: 6 (ref 5.0–7.5)

## 2024-08-30 LAB — MICROSCOPIC EXAMINATION
Bacteria, UA: NONE SEEN
Epithelial Cells (non renal): >10 /HPF — AB (ref 0–10)
RBC, Urine: NONE SEEN /HPF (ref 0–2)

## 2024-08-30 LAB — BLADDER SCAN AMB NON-IMAGING: Scan Result: 6

## 2024-08-30 MED ORDER — ESTRADIOL 0.01 % VA CREA: 1.0000 | TOPICAL_CREAM | VAGINAL | 12 refills | Status: AC

## 2024-08-30 NOTE — Assessment & Plan Note (Addendum)
 Reports 3-4 UTI / year No culture data or referral records Hx of colpocleisis c/b pneumonia, Left perinephric abscess (Urogyn in [place] in 2024)  PVR 6 ml today   Multiple negative UAs earlier this year in our system   We reviewed the core principles of recurrent UTI management in women, including relevant physiology, proper diagnostic evaluation, and the full spectrum of treatment options. I outlined the range of management strategies, noting the varying degrees of evidence supporting each. For many women, attention to lifestyle and conservative behavioral measures can significantly reduce infection frequency and improve quality of life. First-line steps include maintaining adequate hydration, avoiding constipation, and practicing proper bladder hygiene. Over-the-counter supplements such as cranberry juice or extracts may be incorporated into the diet, as they have demonstrated benefit in reducing UTI frequency. Other strategies with more limited evidence--but low risk and ease of implementation--include D-mannose and probiotics.  For postmenopausal women, we discussed the use of vaginal estrogen, its role in addressing vaginal atrophy, restoring urethrogenital flora, and its evidence-based benefit in reducing recurrent UTIs. In refractory cases, options may include a trial of suppressive antibiotic therapy with low-dose daily administration. Alternative approaches include post-coital antibiotic prophylaxis or a "pill-in-the-pocket" regimen for self-treatment in select uncomplicated cases.  - Recommend follow-up with her operating surgeon in urogynecology - We may continue to follow and help workup for UTIs concurrently - Would appreciate fax of outside record /prior culture data and antibiotic treatments - Future UTI workup-she needs to obtain urinalysis and urine culture prior to initiation of antibiotics  - As she had previously tried vaginal estrogen, we will restart this today. Otherwise,  will establish formal UTI culture data - Follow up UA today - she is at urinary baseline, no acute LUTS

## 2024-09-03 DIAGNOSIS — H35033 Hypertensive retinopathy, bilateral: Secondary | ICD-10-CM | POA: Diagnosis not present

## 2024-09-03 DIAGNOSIS — H35371 Puckering of macula, right eye: Secondary | ICD-10-CM | POA: Diagnosis not present

## 2024-09-03 DIAGNOSIS — H34812 Central retinal vein occlusion, left eye, with macular edema: Secondary | ICD-10-CM | POA: Diagnosis not present

## 2024-09-03 DIAGNOSIS — H43813 Vitreous degeneration, bilateral: Secondary | ICD-10-CM | POA: Diagnosis not present

## 2024-09-17 ENCOUNTER — Ambulatory Visit: Admitting: Urology

## 2024-10-15 ENCOUNTER — Encounter: Payer: Self-pay | Admitting: Physician Assistant

## 2024-10-15 ENCOUNTER — Ambulatory Visit (INDEPENDENT_AMBULATORY_CARE_PROVIDER_SITE_OTHER): Admitting: Physician Assistant

## 2024-10-15 VITALS — BP 174/75 | HR 80 | Ht 59.0 in | Wt 118.0 lb

## 2024-10-15 DIAGNOSIS — N390 Urinary tract infection, site not specified: Secondary | ICD-10-CM | POA: Diagnosis not present

## 2024-10-15 NOTE — Progress Notes (Signed)
 10/15/2024 9:51 AM   Gustav KANDICE Mo 07-Jan-1946 [id number]  CC: Chief Complaint  Patient presents with   Hematuria   HPI: Rita Whitaker is a 78 y.o. female with PMH CKD 3, possible recurrent UTI, and POP s/p colpocleisis in 2024 who presents today for follow-up on estrogen cream.   Today she reports she has been using estrogen cream 3 times weekly as instructed.  She is doing well and has been asymptomatic of UTI since her last office visit.  She has not seen urogynecology for follow-up.  PMH: Past Medical History:  Diagnosis Date   Anemia    Arthritis    neck, foot, hands   Chronic kidney disease    stage 4   GERD (gastroesophageal reflux disease)    Hypertension     Surgical History: Past Surgical History:  Procedure Laterality Date   ABDOMINAL HYSTERECTOMY     ANTERIOR CERVICAL DECOMP/DISCECTOMY FUSION N/A 04/29/2021   Procedure: CERVICAL SIX CORPECTOMY;  Surgeon: Gillie Duncans, MD;  Location: [facility] OR;  Service: Neurosurgery;  Laterality: N/A;   CATARACT EXTRACTION Bilateral    COLONOSCOPY  12/2020   EYE SURGERY Bilateral    cataract removal    Home Medications:  Allergies as of 10/15/2024       Reactions   Demerol [meperidine] Other (See Comments)   Upset stomach   Latex    Gets a rash   Nitrofurantoin Hives   Sulfa Antibiotics Rash        Medication List        Accurate as of October 15, 2024  9:51 AM. If you have any questions, ask your nurse or doctor.          acetaminophen  650 MG CR tablet Commonly known as: TYLENOL  Take 650 mg every 8 (eight) hours as needed by mouth for pain.   amLODipine 2.5 MG tablet Commonly known as: NORVASC Take 2.5 mg by mouth daily.   estradiol  0.01 % Crea vaginal cream Commonly known as: ESTRACE  Place 1 Applicatorful vaginally 3 (three) times a week.   famotidine  20 MG tablet Commonly known as: PEPCID  Take by mouth.   ferrous gluconate  240 (27 FE) MG tablet Commonly known as: FERGON Take  240 mg by mouth 2 (two) times daily.   lisinopril -hydrochlorothiazide  20-25 MG tablet Commonly known as: ZESTORETIC  Take 1 tablet by mouth at bedtime.   melatonin 1 MG Tabs tablet Take 1 mg by mouth.   Menthol  (Topical Analgesic) 4 % Gel Apply topically.   MULTIVITAMIN ADULTS 50+ PO Take 1 tablet daily by mouth.   rosuvastatin 10 MG tablet Commonly known as: CRESTOR   solifenacin 5 MG tablet Commonly known as: VESICARE Take 5 mg daily by mouth.        Allergies:  Allergies[1]  Family History: Family History  Problem Relation Age of Onset   Breast cancer Neg Hx     Social History:   reports that she has never smoked. She has never used smokeless tobacco. She reports current alcohol use. She reports that she does not use drugs.  Physical Exam: BP (!) 174/75   Pulse 80   Ht 4' 11 (1.499 m)   Wt 118 lb (53.5 kg)   BMI 23.83 kg/m   Constitutional:  Alert and oriented, no acute distress, nontoxic appearing HEENT: [place], AT Cardiovascular: No clubbing, cyanosis, or edema Respiratory: Normal respiratory effort, no increased work of breathing Skin: No rashes, bruises or suspicious lesions Neurologic: Grossly intact, no focal deficits, moving all  4 extremities Psychiatric: Normal mood and affect  Assessment & Plan:   1. Recurrent UTI (Primary) No UTI symptoms since last visit, on estrogen cream 3 times weekly.  I recommended that she continue this indefinitely.  She may follow-up with us  as needed.  Return if symptoms worsen or fail to improve.  Lucie Hones, PA-C  [facility]  [address], [address] [place], [place] [address] [phone]     [1]  Allergies Allergen Reactions   Demerol [Meperidine] Other (See Comments)    Upset stomach   Latex     Gets a rash   Nitrofurantoin Hives   Sulfa Antibiotics Rash
